# Patient Record
Sex: Male | Born: 1980 | Race: Black or African American | Hispanic: No | Marital: Married | Smoking: Never smoker
Health system: Southern US, Community
[De-identification: ages and names within clinical notes are randomized; demographics above are authoritative.]

## PROBLEM LIST (undated history)

## (undated) DIAGNOSIS — Z87442 Personal history of urinary calculi: Secondary | ICD-10-CM

## (undated) DIAGNOSIS — K5792 Diverticulitis of intestine, part unspecified, without perforation or abscess without bleeding: Secondary | ICD-10-CM

## (undated) DIAGNOSIS — R7303 Prediabetes: Secondary | ICD-10-CM

## (undated) DIAGNOSIS — G510 Bell's palsy: Secondary | ICD-10-CM

## (undated) DIAGNOSIS — M109 Gout, unspecified: Secondary | ICD-10-CM

## (undated) DIAGNOSIS — G4730 Sleep apnea, unspecified: Secondary | ICD-10-CM

## (undated) DIAGNOSIS — I10 Essential (primary) hypertension: Secondary | ICD-10-CM

---

## 2017-07-30 ENCOUNTER — Encounter: Payer: Self-pay | Admitting: Emergency Medicine

## 2017-07-30 ENCOUNTER — Other Ambulatory Visit: Payer: Self-pay

## 2017-07-30 ENCOUNTER — Emergency Department: Payer: BLUE CROSS/BLUE SHIELD

## 2017-07-30 ENCOUNTER — Emergency Department
Admission: EM | Admit: 2017-07-30 | Discharge: 2017-07-30 | Disposition: A | Discharge status: Home / Self Care | Payer: BLUE CROSS/BLUE SHIELD | Attending: Emergency Medicine | Admitting: Emergency Medicine

## 2017-07-30 DIAGNOSIS — N289 Disorder of kidney and ureter, unspecified: Secondary | ICD-10-CM | POA: Insufficient documentation

## 2017-07-30 DIAGNOSIS — I10 Essential (primary) hypertension: Secondary | ICD-10-CM | POA: Diagnosis not present

## 2017-07-30 DIAGNOSIS — K5792 Diverticulitis of intestine, part unspecified, without perforation or abscess without bleeding: Secondary | ICD-10-CM | POA: Insufficient documentation

## 2017-07-30 DIAGNOSIS — R1032 Left lower quadrant pain: Secondary | ICD-10-CM | POA: Diagnosis present

## 2017-07-30 LAB — URINALYSIS, COMPLETE (UACMP) WITH MICROSCOPIC
Bacteria, UA: NONE SEEN
Bilirubin Urine: NEGATIVE
Glucose, UA: NEGATIVE mg/dL
Ketones, ur: NEGATIVE mg/dL
Leukocytes, UA: NEGATIVE
Nitrite: NEGATIVE
Protein, ur: NEGATIVE mg/dL
Specific Gravity, Urine: 1.018 (ref 1.005–1.030)
pH: 5 (ref 5.0–8.0)

## 2017-07-30 LAB — COMPREHENSIVE METABOLIC PANEL
ALT: 29 U/L (ref 17–63)
AST: 25 U/L (ref 15–41)
Albumin: 4.1 g/dL (ref 3.5–5.0)
Alkaline Phosphatase: 48 U/L (ref 38–126)
Anion gap: 8 (ref 5–15)
BUN: 15 mg/dL (ref 6–20)
CO2: 27 mmol/L (ref 22–32)
Calcium: 9.1 mg/dL (ref 8.9–10.3)
Chloride: 104 mmol/L (ref 101–111)
Creatinine, Ser: 1.31 mg/dL — ABNORMAL HIGH (ref 0.61–1.24)
GFR calc Af Amer: >60 mL/min (ref >60)
GFR calc non Af Amer: >60 mL/min (ref >60)
Glucose, Bld: 95 mg/dL (ref 65–99)
Potassium: 4 mmol/L (ref 3.5–5.1)
Sodium: 139 mmol/L (ref 135–145)
Total Bilirubin: 0.9 mg/dL (ref 0.3–1.2)
Total Protein: 8.2 g/dL — ABNORMAL HIGH (ref 6.5–8.1)

## 2017-07-30 LAB — CBC
HCT: 44.2 % (ref 40.0–52.0)
Hemoglobin: 14.3 g/dL (ref 13.0–18.0)
MCH: 27.5 pg (ref 26.0–34.0)
MCHC: 32.4 g/dL (ref 32.0–36.0)
MCV: 85 fL (ref 80.0–100.0)
Platelets: 316 10*3/uL (ref 150–440)
RBC: 5.2 MIL/uL (ref 4.40–5.90)
RDW: 14.2 % (ref 11.5–14.5)
WBC: 14.8 10*3/uL — ABNORMAL HIGH (ref 3.8–10.6)

## 2017-07-30 LAB — LIPASE, BLOOD: Lipase: 26 U/L (ref 11–51)

## 2017-07-30 MED ORDER — METRONIDAZOLE 500 MG PO TABS: 500.0000 mg | ORAL_TABLET | Freq: Two times a day (BID) | ORAL | 0 refills | Status: AC

## 2017-07-30 MED ORDER — HYDROMORPHONE HCL 1 MG/ML IJ SOLN
INTRAMUSCULAR | Status: AC
  Administered 2017-07-30: 0.5 mg via INTRAVENOUS
  Filled 2017-07-30: qty 1

## 2017-07-30 MED ORDER — METRONIDAZOLE 500 MG PO TABS
500.0000 mg | ORAL_TABLET | Freq: Once | ORAL | Status: AC
  Administered 2017-07-30: 500 mg via ORAL
  Filled 2017-07-30: qty 1

## 2017-07-30 MED ORDER — IOPAMIDOL (ISOVUE-370) INJECTION 76%
100.0000 mL | Freq: Once | INTRAVENOUS | Status: AC | PRN
  Administered 2017-07-30: 100 mL via INTRAVENOUS
  Filled 2017-07-30: qty 100

## 2017-07-30 MED ORDER — ONDANSETRON HCL 4 MG/2ML IJ SOLN
INTRAMUSCULAR | Status: AC
  Administered 2017-07-30: 4 mg via INTRAVENOUS
  Filled 2017-07-30: qty 2

## 2017-07-30 MED ORDER — CIPROFLOXACIN HCL 500 MG PO TABS: 500.0000 mg | ORAL_TABLET | Freq: Two times a day (BID) | ORAL | 0 refills | Status: AC

## 2017-07-30 MED ORDER — SODIUM CHLORIDE 0.9 % IV BOLUS (SEPSIS)
1000.0000 mL | Freq: Once | INTRAVENOUS | Status: AC
Start: 2017-07-30 — End: 2017-07-30
  Administered 2017-07-30: 1000 mL via INTRAVENOUS

## 2017-07-30 MED ORDER — OXYCODONE-ACETAMINOPHEN 5-325 MG PO TABS: 1.0000 | ORAL_TABLET | ORAL | 0 refills | Status: AC | PRN

## 2017-07-30 MED ORDER — HYDROMORPHONE HCL 1 MG/ML IJ SOLN
0.5000 mg | Freq: Once | INTRAMUSCULAR | Status: AC
  Administered 2017-07-30: 0.5 mg via INTRAVENOUS

## 2017-07-30 MED ORDER — CIPROFLOXACIN HCL 500 MG PO TABS
500.0000 mg | ORAL_TABLET | Freq: Once | ORAL | Status: AC
  Administered 2017-07-30: 500 mg via ORAL
  Filled 2017-07-30: qty 1

## 2017-07-30 MED ORDER — ONDANSETRON HCL 4 MG/2ML IJ SOLN
4.0000 mg | Freq: Once | INTRAMUSCULAR | Status: AC
  Administered 2017-07-30: 4 mg via INTRAVENOUS

## 2017-07-30 NOTE — ED Notes (Signed)
Pt discharged home after verbalizing understanding of discharge instructions; nad noted. 

## 2017-07-30 NOTE — ED Provider Notes (Signed)
[facility] Emergency Department Provider Note  ____________________________________________  Time seen: Approximately 2:26 PM  I have reviewed the triage vital signs and the nursing notes.   HISTORY  Chief Complaint Abdominal Pain    HPI Terry Weber is a 37 y.o. male with a history of obesity but otherwise healthy presenting with left lower quadrant pain.  The patient reports that since yesterday around 2 PM, the patient has had a "sharp" left lower quadrant pain that is worse with positional changes.  He has had a decrease in his appetite but denies any nausea or vomiting.  His last bowel movement was yesterday at 5:30 PM but it was hard and small.  No fevers, chills, urinary symptoms.  Has not tried anything for his pain.  No history of abdominal surgery.  On arrival to the emergency department, the patient's blood pressure is 183/81 and while he has not seen his physician in in a year or so, he has never been diagnosed with hypertension in the past.  History reviewed. No pertinent past medical history.  There are no active problems to display for this patient.   History reviewed. No pertinent surgical history.    Allergies Patient has no known allergies.  No family history on file.  Social History Social History   Tobacco Use  . Smoking status: Never Smoker  . Smokeless tobacco: Never Used  Substance Use Topics  . Alcohol use: No    Frequency: Never  . Drug use: No    Review of Systems Constitutional: No fever/chills.  No lightheadedness or syncope. Eyes: No visual changes. ENT: No sore throat. No congestion or rhinorrhea. Cardiovascular: Denies chest pain. Denies palpitations. Respiratory: Denies shortness of breath.  No cough. Gastrointestinal: Positive left lower quadrant abdominal pain.  No nausea, no vomiting.  Positive decreased appetite.  No diarrhea.  Positive constipation. Genitourinary: Negative for dysuria. Musculoskeletal:  Negative for back pain. Skin: Negative for rash. Neurological: Negative for headaches. No focal numbness, tingling or weakness.     ____________________________________________   PHYSICAL EXAM:  VITAL SIGNS: ED Triage Vitals  Enc Vitals Group     BP 07/30/17 1004 140/83     Pulse Rate 07/30/17 1004 82     Resp 07/30/17 1004 20     Temp 07/30/17 1004 99.1 F (37.3 C)     Temp Source 07/30/17 1004 Oral     SpO2 07/30/17 1004 97 %     Weight 07/30/17 1006 (!) 330 lb (149.7 kg)     Height 07/30/17 1006 6\' 3"  (1.905 m)     Head Circumference --      Peak Flow --      Pain Score 07/30/17 1008 6     Pain Loc --      Pain Edu? --      Excl. in GC? --     Constitutional: Alert and oriented. Well appearing and in no acute distress. Answers questions appropriately. Eyes: Conjunctivae are normal.  EOMI. No scleral icterus. Head: Atraumatic. Nose: No congestion/rhinnorhea. Mouth/Throat: Mucous membranes are mildly dry.  Neck: No stridor.  Supple.   Cardiovascular: Normal rate, regular rhythm. No murmurs, rubs or gallops.  Respiratory: Normal respiratory effort.  No accessory muscle use or retractions. Lungs CTAB.  No wheezes, rales or ronchi. Gastrointestinal: Morbidly obese soft, and nondistended.  Tender to palpation in the left lower quadrant peer no guarding or rebound.  No peritoneal signs. Musculoskeletal: No LE edema. Neurologic:  A&Ox3.  Speech is clear.  Face and smile are symmetric.  EOMI.  Moves all extremities well. Skin:  Skin is warm, dry and intact. No rash noted. Psychiatric: Mood and affect are normal. Speech and behavior are normal.  Normal judgement.  ____________________________________________   LABS (all labs ordered are listed, but only abnormal results are displayed)  Labs Reviewed  COMPREHENSIVE METABOLIC PANEL - Abnormal; Notable for the following components:      Result Value   Creatinine, Ser 1.31 (*)    Total Protein 8.2 (*)    All other  components within normal limits  CBC - Abnormal; Notable for the following components:   WBC 14.8 (*)    All other components within normal limits  URINALYSIS, COMPLETE (UACMP) WITH MICROSCOPIC - Abnormal; Notable for the following components:   Color, Urine YELLOW (*)    APPearance CLEAR (*)    Hgb urine dipstick SMALL (*)    Squamous Epithelial / LPF 0-5 (*)    All other components within normal limits  LIPASE, BLOOD   ____________________________________________  EKG  Not indicated ____________________________________________  RADIOLOGY  Ct Abdomen Pelvis W Contrast  Result Date: 07/30/2017 CLINICAL DATA:  Left lower quadrant abdomen pain for 1.5 days. EXAM: CT ABDOMEN AND PELVIS WITH CONTRAST TECHNIQUE: Multidetector CT imaging of the abdomen and pelvis was performed using the standard protocol following bolus administration of intravenous contrast. CONTRAST:  ISOVUE-370 IOPAMIDOL (ISOVUE-370) INJECTION 76% COMPARISON:  None. FINDINGS: Lower chest: No acute abnormality. Hepatobiliary: No focal liver abnormality is seen. No gallstones, gallbladder wall thickening, or biliary dilatation. Pancreas: Unremarkable. No pancreatic ductal dilatation or surrounding inflammatory changes. Spleen: Normal in size without focal abnormality. Adrenals/Urinary Tract: Adrenal glands are unremarkable. Kidneys are normal, without renal calculi, focal lesion, or hydronephrosis. Bladder is unremarkable. Stomach/Bowel: The stomach is normal. There is no small bowel obstruction. The appendix is normal. There is inflammation and bowel wall thickening surrounding the distal descending colon/proximal sigmoid colon. The colon is otherwise normal. Vascular/Lymphatic: Aortic atherosclerosis. No enlarged abdominal or pelvic lymph nodes. Reproductive: Prostate is unremarkable. Other: Minimal umbilical herniation of mesenteric fat is noted. Musculoskeletal: Minimal degenerative joint changes of lower thoracic spine  is noted. IMPRESSION: Findings consistent with acute descending colon/sigmoid diverticulitis. Electronically Signed   By: Sherian Rein M.D.   On: 07/30/2017 15:08    ____________________________________________   PROCEDURES  Procedure(s) performed: None  Procedures  Critical Care performed: No ____________________________________________   INITIAL IMPRESSION / ASSESSMENT AND PLAN / ED COURSE  Pertinent labs & imaging results that were available during my care of the patient were reviewed by me and considered in my medical decision making (see chart for details).  37 y.o. male without any history of abdominal surgery presenting with left lower quadrant pain, decreased appetite and constipation.  Here, the patient is afebrile.  He does have a blood pressure 10 D3 over 81 and we will recheck this as he does not have a history of hypertension.  Today he also has some renal insufficiency which could be due to dehydration from his decreased p.o. intake, although it could also be from undiagnosed hypertension.  We will treat him with fluids, and he has been counseled to have his kidney function rechecked with his primary care physician.  For the patient's abdominal pain, I am concerned about diverticulitis.  Partial small bowel obstruction is less likely especially with a negative abdomen.  Aortic pathology is very unlikely.  We will proceed with a CT scan of the abdomen to evaluate for diverticulitis and/or  any complications from diverticulitis or other pathology.  Symptomatic treatment has been ordered.  Plan reevaluation for final disposition.  ----------------------------------------- 3:29 PM on 07/30/2017 -----------------------------------------  The patient's repeat blood pressure without any intervention other than pain medication is 133/82.  I have asked him to follow-up with his primary care physician for this.  He has received intravenous fluids, which should help his renal  insufficiency but this will also be rechecked by a primary care physician.  The patient CT scan is consistent with uncomplicated sigmoid and colonic diverticulitis and he has received his first dose of oral antibiotics here.  I talked to him about taking the entire course of antibiotics at home.  He will follow-up with a primary care physician.  Return precautions were discussed.  At this time, the patient is tolerating liquids and safe for discharge home.  ____________________________________________  FINAL CLINICAL IMPRESSION(S) / ED DIAGNOSES  Final diagnoses:  Diverticulitis  Hypertension, unspecified type  Renal insufficiency         NEW MEDICATIONS STARTED DURING THIS VISIT:  New Prescriptions   CIPROFLOXACIN (CIPRO) 500 MG TABLET    Take 1 tablet (500 mg total) by mouth 2 (two) times daily for 10 days.   METRONIDAZOLE (FLAGYL) 500 MG TABLET    Take 1 tablet (500 mg total) by mouth 2 (two) times daily for 10 days.   OXYCODONE-ACETAMINOPHEN (PERCOCET) 5-325 MG TABLET    Take 1 tablet by mouth every 4 (four) hours as needed for severe pain.      Rockne Menghini, MD 07/30/17 1530

## 2017-07-30 NOTE — ED Triage Notes (Signed)
Pt c/o left lower quad abd pain since yesterday. Pt states that he has had nausea without vomiting and constipation. Pt in NAD at this time.

## 2017-07-30 NOTE — ED Triage Notes (Signed)
First nurse.  Left lower quad pain

## 2017-07-30 NOTE — ED Notes (Signed)
Pt presents with llq pain since yesterday. States he feels like he may be constipated; denies urinary symptoms. States "it feels like the muscle in that area might be stressed or strained." Pt alert & oriented with NAD noted.

## 2017-07-30 NOTE — Discharge Instructions (Signed)
Today have an infection in your colon, large intestine, called diverticulitis.  Please take the entire course of antibiotics, even if you are feeling better.  For your pain, you may take Tylenol but please avoid NSAID medications including Advil, ibuprofen, Motrin or Aleve until your kidney function has been rechecked as this can worsen your kidney function.  Percocet is for severe pain.  Do not drive within 8 hours of taking Percocet.  Please drink plenty of fluids stay well-hydrated.  Please take a full liquid diet for the next 24 hours and then advance to bland diet as tolerated.  Your kidney function was slightly abnormal today.  This can be from dehydration, but there are other possible causes.  Your kidney function will need to be rechecked by your primary care physician or [facility].  You will also need to have your blood pressure rechecked.  Return to the emergency department if you develop severe pain, inability to keep down fluids, fever, or any other symptoms concerning to you.

## 2019-08-17 ENCOUNTER — Emergency Department
Admission: EM | Admit: 2019-08-17 | Discharge: 2019-08-17 | Disposition: A | Discharge status: Home / Self Care | Payer: BC Managed Care – PPO | Attending: Emergency Medicine | Admitting: Emergency Medicine

## 2019-08-17 ENCOUNTER — Other Ambulatory Visit: Payer: Self-pay

## 2019-08-17 ENCOUNTER — Emergency Department: Payer: BC Managed Care – PPO

## 2019-08-17 DIAGNOSIS — R1032 Left lower quadrant pain: Secondary | ICD-10-CM | POA: Diagnosis present

## 2019-08-17 DIAGNOSIS — K5732 Diverticulitis of large intestine without perforation or abscess without bleeding: Secondary | ICD-10-CM | POA: Diagnosis not present

## 2019-08-17 HISTORY — DX: Gout, unspecified: M10.9

## 2019-08-17 LAB — COMPREHENSIVE METABOLIC PANEL
ALT: 27 U/L (ref 0–44)
AST: 24 U/L (ref 15–41)
Albumin: 3.9 g/dL (ref 3.5–5.0)
Alkaline Phosphatase: 55 U/L (ref 38–126)
Anion gap: 8 (ref 5–15)
BUN: 12 mg/dL (ref 6–20)
CO2: 27 mmol/L (ref 22–32)
Calcium: 9.1 mg/dL (ref 8.9–10.3)
Chloride: 105 mmol/L (ref 98–111)
Creatinine, Ser: 1.26 mg/dL — ABNORMAL HIGH (ref 0.61–1.24)
GFR calc Af Amer: >60 mL/min (ref >60)
GFR calc non Af Amer: >60 mL/min (ref >60)
Glucose, Bld: 109 mg/dL — ABNORMAL HIGH (ref 70–99)
Potassium: 4.5 mmol/L (ref 3.5–5.1)
Sodium: 140 mmol/L (ref 135–145)
Total Bilirubin: 0.5 mg/dL (ref 0.3–1.2)
Total Protein: 7.8 g/dL (ref 6.5–8.1)

## 2019-08-17 LAB — CBC WITH DIFFERENTIAL/PLATELET
Abs Immature Granulocytes: 0.04 10*3/uL (ref 0.00–0.07)
Basophils Absolute: 0 10*3/uL (ref 0.0–0.1)
Basophils Relative: 0 %
Eosinophils Absolute: 0.1 10*3/uL (ref 0.0–0.5)
Eosinophils Relative: 1 %
HCT: 43.7 % (ref 39.0–52.0)
Hemoglobin: 13.8 g/dL (ref 13.0–17.0)
Immature Granulocytes: 0 %
Lymphocytes Relative: 16 %
Lymphs Abs: 2.1 10*3/uL (ref 0.7–4.0)
MCH: 27.4 pg (ref 26.0–34.0)
MCHC: 31.6 g/dL (ref 30.0–36.0)
MCV: 86.7 fL (ref 80.0–100.0)
Monocytes Absolute: 1.2 10*3/uL — ABNORMAL HIGH (ref 0.1–1.0)
Monocytes Relative: 9 %
Neutro Abs: 10.1 10*3/uL — ABNORMAL HIGH (ref 1.7–7.7)
Neutrophils Relative %: 74 %
Platelets: 321 10*3/uL (ref 150–400)
RBC: 5.04 MIL/uL (ref 4.22–5.81)
RDW: 13.7 % (ref 11.5–15.5)
WBC: 13.5 10*3/uL — ABNORMAL HIGH (ref 4.0–10.5)
nRBC: 0 % (ref 0.0–0.2)

## 2019-08-17 LAB — URINALYSIS, COMPLETE (UACMP) WITH MICROSCOPIC
Bacteria, UA: NONE SEEN
Bilirubin Urine: NEGATIVE
Glucose, UA: NEGATIVE mg/dL
Ketones, ur: NEGATIVE mg/dL
Leukocytes,Ua: NEGATIVE
Nitrite: NEGATIVE
Protein, ur: NEGATIVE mg/dL
Specific Gravity, Urine: 1.017 (ref 1.005–1.030)
pH: 5 (ref 5.0–8.0)

## 2019-08-17 MED ORDER — CIPROFLOXACIN HCL 500 MG PO TABS
500.0000 mg | ORAL_TABLET | Freq: Once | ORAL | Status: AC
  Administered 2019-08-17: 500 mg via ORAL
  Filled 2019-08-17: qty 1

## 2019-08-17 MED ORDER — METRONIDAZOLE 500 MG PO TABS
500.0000 mg | ORAL_TABLET | Freq: Once | ORAL | Status: AC
Start: 2019-08-17 — End: 2019-08-17
  Administered 2019-08-17: 500 mg via ORAL
  Filled 2019-08-17: qty 1

## 2019-08-17 MED ORDER — CIPROFLOXACIN HCL 500 MG PO TABS: 500.0000 mg | ORAL_TABLET | Freq: Two times a day (BID) | ORAL | 0 refills | Status: AC

## 2019-08-17 MED ORDER — METRONIDAZOLE 500 MG PO TABS: 500.0000 mg | ORAL_TABLET | Freq: Three times a day (TID) | ORAL | 0 refills | Status: DC

## 2019-08-17 MED ORDER — OXYCODONE-ACETAMINOPHEN 5-325 MG PO TABS: 1.0000 | ORAL_TABLET | Freq: Three times a day (TID) | ORAL | 0 refills | Status: AC | PRN

## 2019-08-17 NOTE — ED Provider Notes (Signed)
[facility] Emergency Department Provider Note       Time seen: ----------------------------------------- 8:49 AM on 08/17/2019 -----------------------------------------   I have reviewed the triage vital signs and the nursing notes.  HISTORY   Chief Complaint Abdominal Pain    HPI Terry Weber is a 39 y.o. male with a history of gout who presents to the ED for left-sided abdominal pain since Saturday.  Patient denies nausea, vomiting or diarrhea.  Patient states he took the magnesium citrate and had a bowel movement that made him feel slightly better but the pain recurred.  Discomfort was 6 out of 10.  Past Medical History:  Diagnosis Date  . Gout     There are no problems to display for this patient.   History reviewed. No pertinent surgical history.  Allergies Patient has no known allergies.  Social History Social History   Tobacco Use  . Smoking status: Never Smoker  . Smokeless tobacco: Never Used  Substance Use Topics  . Alcohol use: No  . Drug use: No    Review of Systems Constitutional: Negative for fever. Cardiovascular: Negative for chest pain. Respiratory: Negative for shortness of breath. Gastrointestinal: Positive for abdominal pain, constipation Musculoskeletal: Negative for back pain. Skin: Negative for rash. Neurological: Negative for headaches, focal weakness or numbness.  All systems negative/normal/unremarkable except as stated in the HPI  ____________________________________________   PHYSICAL EXAM:  VITAL SIGNS: ED Triage Vitals  Enc Vitals Group     BP 08/17/19 0841 128/81     Pulse Rate 08/17/19 0841 77     Resp 08/17/19 0841 19     Temp 08/17/19 0841 98.8 F (37.1 C)     Temp src --      SpO2 08/17/19 0841 98 %     Weight 08/17/19 0839 (!) 340 lb (154.2 kg)     Height 08/17/19 0839 6\' 3"  (1.905 m)     Head Circumference --      Peak Flow --      Pain Score 08/17/19 0839 6     Pain Loc --    Pain Edu? --      Excl. in GC? --     Constitutional: Alert and oriented. Well appearing and in no distress. Eyes: Conjunctivae are normal. Normal extraocular movements. Cardiovascular: Normal rate, regular rhythm. No murmurs, rubs, or gallops. Respiratory: Normal respiratory effort without tachypnea nor retractions. Breath sounds are clear and equal bilaterally. No wheezes/rales/rhonchi. Gastrointestinal: Mild left flank tenderness, no rebound or guarding.  Normal bowel sounds. Musculoskeletal: Nontender with normal range of motion in extremities. No lower extremity tenderness nor edema. Neurologic:  Normal speech and language. No gross focal neurologic deficits are appreciated.  Skin:  Skin is warm, dry and intact. No rash noted. Psychiatric: Mood and affect are normal. Speech and behavior are normal.  ___________________________________________  ED COURSE:  As part of my medical decision making, I reviewed the following data within the electronic MEDICAL RECORD NUMBER History obtained from family if available, nursing notes, old chart and ekg, as well as notes from prior ED visits. Patient presented for abdominal pain, we will assess with labs and imaging as indicated at this time.   Procedures  Maxx Pham was evaluated in Emergency Department on 08/17/2019 for the symptoms described in the history of present illness. He was evaluated in the context of the global COVID-19 pandemic, which necessitated consideration that the patient might be at risk for infection with the SARS-CoV-2 virus that causes COVID-19. Institutional  protocols and algorithms that pertain to the evaluation of patients at risk for COVID-19 are in a state of rapid change based on information released by regulatory bodies including the CDC and federal and state organizations. These policies and algorithms were followed during the patient's care in the ED.  ____________________________________________   LABS (pertinent  positives/negatives)  Labs Reviewed  CBC WITH DIFFERENTIAL/PLATELET - Abnormal; Notable for the following components:      Result Value   WBC 13.5 (*)    Neutro Abs 10.1 (*)    Monocytes Absolute 1.2 (*)    All other components within normal limits  COMPREHENSIVE METABOLIC PANEL - Abnormal; Notable for the following components:   Glucose, Bld 109 (*)    Creatinine, Ser 1.26 (*)    All other components within normal limits  URINALYSIS, COMPLETE (UACMP) WITH MICROSCOPIC    RADIOLOGY Images were viewed by me  CT renal protocol IMPRESSION:  1. Localized diverticulitis in the distal descending colon. No  evident perforation or abscess in this area. Foci of diverticulosis  noted elsewhere in the descending and sigmoid colon regions without  inflammation elsewhere beyond the localized diverticulitis in the  distal descending colon.   2. No bowel obstruction. No abscess in the abdomen or pelvis.  Appendix appears normal.   3. Nonobstructing 2 mm calculus in each kidney. No hydronephrosis or  ureteral calculus on either side. Urinary bladder wall thickness  normal.   4. Small umbilical hernia containing only fat.  ____________________________________________   DIFFERENTIAL DIAGNOSIS   Renal colic, UTI, pyelonephritis, constipation, diverticulitis  FINAL ASSESSMENT AND PLAN  Flank pain, diverticulitis   Plan: The patient had presented for left flank pain. Patient's labs did reveal leukocytosis. Patient's imaging revealed a localized diverticulitis in the descending colon.  Patient will be given Cipro, Flagyl and pain medicine.  Be referred to his doctor for close outpatient follow-up.   Laurence Aly, MD    Note: This note was generated in part or whole with voice recognition software. Voice recognition is usually quite accurate but there are transcription errors that can and very often do occur. I apologize for any typographical errors that were not detected and  corrected.     Earleen Newport, MD 08/17/19 [phone]

## 2019-08-17 NOTE — ED Notes (Signed)
Introduced self to pt and visitor at bedside. Pt declined blanket. Pt states has been updated by EDP. Requested "timeframe" from [facility]. EDP notified. Denies any other needs currently. Bed locked low. Rail up. Call bell within reach.

## 2019-08-17 NOTE — ED Triage Notes (Signed)
Pt c/o left sided abd pain since Saturday. Denies N/V/D.Marland Kitchen states he took the magnesium citrate and had a BM and was better for a little while but returned.

## 2019-10-14 ENCOUNTER — Ambulatory Visit: Payer: BC Managed Care – PPO | Admitting: Urology

## 2019-10-20 ENCOUNTER — Ambulatory Visit: Payer: BC Managed Care – PPO | Admitting: *Deleted

## 2019-11-04 ENCOUNTER — Ambulatory Visit: Payer: BC Managed Care – PPO | Admitting: Urology

## 2020-01-04 ENCOUNTER — Other Ambulatory Visit: Payer: BC Managed Care – PPO

## 2020-01-06 ENCOUNTER — Ambulatory Visit: Admission: RE | Admit: 2020-01-06 | Payer: BC Managed Care – PPO | Source: Ambulatory Visit | Admitting: General Surgery

## 2020-01-06 ENCOUNTER — Encounter: Admission: RE | Payer: Self-pay | Source: Ambulatory Visit

## 2020-01-06 SURGERY — COLONOSCOPY WITH PROPOFOL
Anesthesia: General

## 2020-03-24 ENCOUNTER — Other Ambulatory Visit
Admission: RE | Admit: 2020-03-24 | Discharge: 2020-03-24 | Disposition: A | Discharge status: Home / Self Care | Payer: HRSA Program | Source: Ambulatory Visit | Attending: Gastroenterology | Admitting: Gastroenterology

## 2020-03-24 ENCOUNTER — Other Ambulatory Visit: Payer: Self-pay

## 2020-03-24 DIAGNOSIS — Z20822 Contact with and (suspected) exposure to covid-19: Secondary | ICD-10-CM | POA: Insufficient documentation

## 2020-03-24 DIAGNOSIS — Z01812 Encounter for preprocedural laboratory examination: Secondary | ICD-10-CM | POA: Diagnosis present

## 2020-03-24 LAB — SARS CORONAVIRUS 2 (TAT 6-24 HRS): SARS Coronavirus 2: NEGATIVE

## 2020-03-28 ENCOUNTER — Encounter
Admission: RE | Disposition: A | Discharge status: Home / Self Care | Payer: Self-pay | Source: Ambulatory Visit | Attending: Gastroenterology

## 2020-03-28 ENCOUNTER — Ambulatory Visit: Payer: Self-pay | Admitting: Certified Registered Nurse Anesthetist

## 2020-03-28 ENCOUNTER — Ambulatory Visit
Admission: RE | Admit: 2020-03-28 | Discharge: 2020-03-28 | Disposition: A | Discharge status: Home / Self Care | Payer: Self-pay | Source: Ambulatory Visit | Attending: Gastroenterology | Admitting: Gastroenterology

## 2020-03-28 ENCOUNTER — Other Ambulatory Visit: Payer: Self-pay

## 2020-03-28 DIAGNOSIS — K635 Polyp of colon: Secondary | ICD-10-CM | POA: Insufficient documentation

## 2020-03-28 DIAGNOSIS — K5730 Diverticulosis of large intestine without perforation or abscess without bleeding: Secondary | ICD-10-CM | POA: Insufficient documentation

## 2020-03-28 DIAGNOSIS — K640 First degree hemorrhoids: Secondary | ICD-10-CM | POA: Insufficient documentation

## 2020-03-28 DIAGNOSIS — Z87442 Personal history of urinary calculi: Secondary | ICD-10-CM | POA: Insufficient documentation

## 2020-03-28 DIAGNOSIS — I10 Essential (primary) hypertension: Secondary | ICD-10-CM | POA: Insufficient documentation

## 2020-03-28 DIAGNOSIS — Z79899 Other long term (current) drug therapy: Secondary | ICD-10-CM | POA: Insufficient documentation

## 2020-03-28 HISTORY — PX: COLONOSCOPY WITH PROPOFOL: SHX5780

## 2020-03-28 HISTORY — DX: Essential (primary) hypertension: I10

## 2020-03-28 HISTORY — DX: Personal history of urinary calculi: Z87.442

## 2020-03-28 HISTORY — DX: Sleep apnea, unspecified: G47.30

## 2020-03-28 SURGERY — COLONOSCOPY WITH PROPOFOL
Anesthesia: General

## 2020-03-28 MED ORDER — LIDOCAINE HCL (CARDIAC) PF 100 MG/5ML IV SOSY
PREFILLED_SYRINGE | INTRAVENOUS | Status: DC | PRN
  Administered 2020-03-28: 50 mg via INTRAVENOUS

## 2020-03-28 MED ORDER — PROPOFOL 10 MG/ML IV BOLUS
INTRAVENOUS | Status: DC | PRN
  Administered 2020-03-28: 30 mg via INTRAVENOUS
  Administered 2020-03-28: 70 mg via INTRAVENOUS

## 2020-03-28 MED ORDER — PROPOFOL 500 MG/50ML IV EMUL
INTRAVENOUS | Status: DC | PRN
  Administered 2020-03-28: 150 ug/kg/min via INTRAVENOUS

## 2020-03-28 MED ORDER — SODIUM CHLORIDE 0.9 % IV SOLN: INTRAVENOUS | Status: DC

## 2020-03-28 MED ORDER — PROPOFOL 500 MG/50ML IV EMUL
INTRAVENOUS | Status: AC
  Filled 2020-03-28: qty 50

## 2020-03-28 MED ORDER — LIDOCAINE HCL (PF) 2 % IJ SOLN
INTRAMUSCULAR | Status: AC
  Filled 2020-03-28: qty 5

## 2020-03-28 NOTE — Anesthesia Postprocedure Evaluation (Signed)
Anesthesia Post Note  Patient: Terry Weber  Procedure(s) Performed: COLONOSCOPY WITH PROPOFOL (N/A )  Patient location during evaluation: PACU Anesthesia Type: General Level of consciousness: awake and alert Pain management: pain level controlled Vital Signs Assessment: post-procedure vital signs reviewed and stable Respiratory status: spontaneous breathing, nonlabored ventilation and respiratory function stable Cardiovascular status: blood pressure returned to baseline and stable Postop Assessment: no apparent nausea or vomiting Anesthetic complications: no   No complications documented.   Last Vitals:  Vitals:   03/28/20 0853 03/28/20 0939  BP: (!) 137/95 (!) 97/55  Pulse: 90 83  Resp: 18 16  Temp: (!) 35.7 C 36.7 C  SpO2: 100% 98%    Last Pain:  Vitals:   03/28/20 0959  TempSrc:   PainSc: 0-No pain                 Aurelio Brash Gayna Braddy

## 2020-03-28 NOTE — Op Note (Signed)
[facility] Gastroenterology Patient Name: Terry Weber Procedure Date: 03/28/2020 9:02 AM MRN: [id number] Account #: [id number] Date of Birth: [birth year] Admit Type: Outpatient Age: 39 Room: [facility] ENDO ROOM 3 Gender: Male Note Status: Finalized Procedure:             Colonoscopy Indications:           Abnormal CT of the GI tract, Diverticulitis Providers:             Andrey Farmer MD, MD Medicines:             Monitored Anesthesia Care Complications:         No immediate complications. Estimated blood loss:                         Minimal. Procedure:             Pre-Anesthesia Assessment:                        - Prior to the procedure, a History and Physical was                         performed, and patient medications and allergies were                         reviewed. The patient is competent. The risks and                         benefits of the procedure and the sedation options and                         risks were discussed with the patient. All questions                         were answered and informed consent was obtained.                         Patient identification and proposed procedure were                         verified by the physician, the nurse, the anesthetist                         and the technician in the endoscopy suite. Mental                         Status Examination: alert and oriented. Airway                         Examination: normal oropharyngeal airway and neck                         mobility. Respiratory Examination: clear to                         auscultation. CV Examination: normal. Prophylactic                         Antibiotics: The patient does not require prophylactic  antibiotics. Prior Anticoagulants: The patient has                         taken no previous anticoagulant or antiplatelet                         agents. ASA Grade Assessment: II - A patient with mild                          systemic disease. After reviewing the risks and                         benefits, the patient was deemed in satisfactory                         condition to undergo the procedure. The anesthesia                         plan was to use monitored anesthesia care (MAC).                         Immediately prior to administration of medications,                         the patient was re-assessed for adequacy to receive                         sedatives. The heart rate, respiratory rate, oxygen                         saturations, blood pressure, adequacy of pulmonary                         ventilation, and response to care were monitored                         throughout the procedure. The physical status of the                         patient was re-assessed after the procedure.                        After obtaining informed consent, the colonoscope was                         passed under direct vision. Throughout the procedure,                         the patient's blood pressure, pulse, and oxygen                         saturations were monitored continuously. The                         Colonoscope was introduced through the anus and                         advanced to the the cecum, identified by appendiceal  orifice and ileocecal valve. The colonoscopy was                         performed without difficulty. The patient tolerated                         the procedure well. The quality of the bowel                         preparation was good. Findings:      The perianal and digital rectal examinations were normal.      A single small-mouthed diverticulum was found in the ascending colon.      Many small-mouthed diverticula were found in the sigmoid colon,       descending colon and splenic flexure.      A less than 1 mm polyp was found in the sigmoid colon. The polyp was       sessile. The polyp was removed with a jumbo cold forceps. Resection and        retrieval were complete. Estimated blood loss was minimal.      Non-bleeding internal hemorrhoids were found during retroflexion. The       hemorrhoids were Grade I (internal hemorrhoids that do not prolapse).      The exam was otherwise without abnormality on direct and retroflexion       views. Impression:            - Diverticulosis in the ascending colon.                        - Diverticulosis in the sigmoid colon, in the                         descending colon and at the splenic flexure.                        - One less than 1 mm polyp in the sigmoid colon,                         removed with a jumbo cold forceps. Resected and                         retrieved.                        - Non-bleeding internal hemorrhoids.                        - The examination was otherwise normal on direct and                         retroflexion views. Recommendation:        - Discharge patient to home.                        - Resume previous diet.                        - Continue present medications.                        -  Await pathology results.                        - Repeat colonoscopy in 10 years for screening                         purposes.                        - Return to referring physician as previously                         scheduled. Procedure Code(s):     --- Professional ---                        [id number], Colonoscopy, flexible; with biopsy, single or                         multiple Diagnosis Code(s):     --- Professional ---                        K64.0, First degree hemorrhoids                        K63.5, Polyp of colon                        K57.32, Diverticulitis of large intestine without                         perforation or abscess without bleeding                        K57.30, Diverticulosis of large intestine without                         perforation or abscess without bleeding                        R93.3, Abnormal findings on diagnostic imaging of                          other parts of digestive tract CPT copyright 2019 American Medical Association. All rights reserved. The codes documented in this report are preliminary and upon coder review may  be revised to meet current compliance requirements. Andrey Farmer, MD Andrey Farmer MD, MD 03/28/2020 9:40:05 AM Number of Addenda: 0 Note Initiated On: 03/28/2020 9:02 AM Scope Withdrawal Time: 0 hours 8 minutes 17 seconds  Total Procedure Duration: 0 hours 12 minutes 49 seconds  Estimated Blood Loss:  Estimated blood loss was minimal.      [facility]

## 2020-03-28 NOTE — Transfer of Care (Signed)
Immediate Anesthesia Transfer of Care Note  Patient: Terry Weber  Procedure(s) Performed: COLONOSCOPY WITH PROPOFOL (N/A )  Patient Location: Endoscopy Unit  Anesthesia Type:General  Level of Consciousness: drowsy  Airway & Oxygen Therapy: Patient Spontanous Breathing  Post-op Assessment: Report given to RN and Post -op Vital signs reviewed and stable  Post vital signs: Reviewed and stable  Last Vitals:  Vitals Value Taken Time  BP 97/55 03/28/20 0942  Temp 36.7 C 03/28/20 0939  Pulse 80 03/28/20 0942  Resp 13 03/28/20 0942  SpO2 97 % 03/28/20 0942  Vitals shown include unvalidated device data.  Last Pain:  Vitals:   03/28/20 0939  TempSrc: Temporal  PainSc: Asleep         Complications: No complications documented.

## 2020-03-28 NOTE — Interval H&P Note (Signed)
History and Physical Interval Note:  03/28/2020 9:14 AM  Terry Weber  has presented today for surgery, with the diagnosis of DIVERTICULITIS.  The various methods of treatment have been discussed with the patient and family. After consideration of risks, benefits and other options for treatment, the patient has consented to  Procedure(s): COLONOSCOPY WITH PROPOFOL (N/A) as a surgical intervention.  The patient's history has been reviewed, patient examined, no change in status, stable for surgery.  I have reviewed the patient's chart and labs.  Questions were answered to the patient's satisfaction.     Regis Bill  Ok to proceed with colonoscopy

## 2020-03-28 NOTE — H&P (Signed)
Outpatient short stay form Pre-procedure 03/28/2020 9:12 AM Merlyn Lot MD, MPH  Primary Physician: [facility]  Reason for visit:  Hx of diverticulitis  History of present illness:   39 y/o gentleman with history of two episodes of diverticulitis with last episodes > 6 weeks ago and patient is currently pain free. No abdominal surgeries, blood thinners, or family history of GI malignancies. This is his first colonoscopy.    Current Facility-Administered Medications:  .  0.9 %  sodium chloride infusion, , Intravenous, Continuous, Johnmatthew Solorio, Rossie Muskrat, MD, Last Rate: 20 mL/hr at 03/28/20 0911, New Bag at 03/28/20 0911  Medications Prior to Admission  Medication Sig Dispense Refill Last Dose  . allopurinol (ZYLOPRIM) 100 MG tablet Take 100 mg by mouth 2 (two) times daily.   Past Week at Unknown time  . atorvastatin (LIPITOR) 20 MG tablet Take 20 mg by mouth daily.   Past Week at Unknown time  . losartan (COZAAR) 25 MG tablet Take 25 mg by mouth daily.     Marland Kitchen oxyCODONE-acetaminophen (PERCOCET) 5-325 MG tablet Take 1 tablet by mouth every 4 (four) hours as needed for severe pain. 12 tablet 0   . oxyCODONE-acetaminophen (PERCOCET) 5-325 MG tablet Take 1 tablet by mouth every 8 (eight) hours as needed. 20 tablet 0      No Known Allergies   Past Medical History:  Diagnosis Date  . Gout   . History of kidney stones   . Hypertension   . Sleep apnea     Review of systems:  Otherwise negative.    Physical Exam  Gen: Alert, oriented. Appears stated age.  HEENT: PERRLA. Lungs: No respiratory distress CV: RRR Abd: soft, benign, no masses. Ext: No edema.     Planned procedures: Proceed with colonoscopy. The patient understands the nature of the planned procedure, indications, risks, alternatives and potential complications including but not limited to bleeding, infection, perforation, damage to internal organs and possible oversedation/side effects from anesthesia.  The patient agrees and gives consent to proceed.  Please refer to procedure notes for findings, recommendations and patient disposition/instructions.     Merlyn Lot MD, MPH Gastroenterology 03/28/2020  9:12 AM

## 2020-03-28 NOTE — Anesthesia Preprocedure Evaluation (Signed)
Anesthesia Evaluation  Patient identified by MRN, date of birth, ID band Patient awake    Reviewed: Allergy & Precautions, H&P , NPO status , Patient's Chart, lab work & pertinent test results  History of Anesthesia Complications Negative for: history of anesthetic complications  Airway Mallampati: III  TM Distance: >3 FB Neck ROM: full    Dental  (+) Teeth Intact   Pulmonary sleep apnea , neg COPD,    breath sounds clear to auscultation       Cardiovascular hypertension, (-) angina(-) Past MI and (-) Cardiac Stents (-) dysrhythmias  Rhythm:regular Rate:Normal     Neuro/Psych negative neurological ROS  negative psych ROS   GI/Hepatic negative GI ROS, Neg liver ROS,   Endo/Other  negative endocrine ROS  Renal/GU negative Renal ROS  negative genitourinary   Musculoskeletal   Abdominal   Peds  Hematology negative hematology ROS (+)   Anesthesia Other Findings Past Medical History: No date: Gout No date: History of kidney stones No date: Hypertension No date: Sleep apnea  No past surgical history on file.  BMI    Body Mass Index: 42.50 kg/m      Reproductive/Obstetrics negative OB ROS                             Anesthesia Physical Anesthesia Plan  ASA: III  Anesthesia Plan: General   Post-op Pain Management:    Induction:   PONV Risk Score and Plan: Propofol infusion and TIVA  Airway Management Planned: Simple Face Mask  Additional Equipment:   Intra-op Plan:   Post-operative Plan:   Informed Consent: I have reviewed the patients History and Physical, chart, labs and discussed the procedure including the risks, benefits and alternatives for the proposed anesthesia with the patient or authorized representative who has indicated his/her understanding and acceptance.     Dental Advisory Given  Plan Discussed with: Anesthesiologist, CRNA and Surgeon  Anesthesia  Plan Comments:         Anesthesia Quick Evaluation

## 2020-03-29 LAB — SURGICAL PATHOLOGY

## 2020-09-22 ENCOUNTER — Encounter: Payer: Self-pay | Admitting: Emergency Medicine

## 2020-09-22 ENCOUNTER — Emergency Department: Payer: BC Managed Care – PPO

## 2020-09-22 ENCOUNTER — Emergency Department
Admission: EM | Admit: 2020-09-22 | Discharge: 2020-09-22 | Disposition: A | Discharge status: Home / Self Care | Payer: BC Managed Care – PPO | Attending: Emergency Medicine | Admitting: Emergency Medicine

## 2020-09-22 ENCOUNTER — Other Ambulatory Visit: Payer: Self-pay

## 2020-09-22 DIAGNOSIS — I10 Essential (primary) hypertension: Secondary | ICD-10-CM | POA: Insufficient documentation

## 2020-09-22 DIAGNOSIS — R109 Unspecified abdominal pain: Secondary | ICD-10-CM | POA: Diagnosis present

## 2020-09-22 DIAGNOSIS — K5732 Diverticulitis of large intestine without perforation or abscess without bleeding: Secondary | ICD-10-CM | POA: Insufficient documentation

## 2020-09-22 DIAGNOSIS — R3121 Asymptomatic microscopic hematuria: Secondary | ICD-10-CM | POA: Diagnosis not present

## 2020-09-22 DIAGNOSIS — K5792 Diverticulitis of intestine, part unspecified, without perforation or abscess without bleeding: Secondary | ICD-10-CM

## 2020-09-22 DIAGNOSIS — Z79899 Other long term (current) drug therapy: Secondary | ICD-10-CM | POA: Insufficient documentation

## 2020-09-22 DIAGNOSIS — R3129 Other microscopic hematuria: Secondary | ICD-10-CM

## 2020-09-22 LAB — URINALYSIS, COMPLETE (UACMP) WITH MICROSCOPIC
Bacteria, UA: NONE SEEN
Bilirubin Urine: NEGATIVE
Glucose, UA: NEGATIVE mg/dL
Ketones, ur: NEGATIVE mg/dL
Leukocytes,Ua: NEGATIVE
Nitrite: NEGATIVE
Protein, ur: NEGATIVE mg/dL
Specific Gravity, Urine: 1.019 (ref 1.005–1.030)
pH: 6 (ref 5.0–8.0)

## 2020-09-22 LAB — CBC
HCT: 43.9 % (ref 39.0–52.0)
Hemoglobin: 14.3 g/dL (ref 13.0–17.0)
MCH: 28.1 pg (ref 26.0–34.0)
MCHC: 32.6 g/dL (ref 30.0–36.0)
MCV: 86.4 fL (ref 80.0–100.0)
Platelets: 316 10*3/uL (ref 150–400)
RBC: 5.08 MIL/uL (ref 4.22–5.81)
RDW: 13.6 % (ref 11.5–15.5)
WBC: 11.4 10*3/uL — ABNORMAL HIGH (ref 4.0–10.5)
nRBC: 0 % (ref 0.0–0.2)

## 2020-09-22 LAB — BASIC METABOLIC PANEL
Anion gap: 8 (ref 5–15)
BUN: 13 mg/dL (ref 6–20)
CO2: 26 mmol/L (ref 22–32)
Calcium: 9 mg/dL (ref 8.9–10.3)
Chloride: 103 mmol/L (ref 98–111)
Creatinine, Ser: 1.35 mg/dL — ABNORMAL HIGH (ref 0.61–1.24)
GFR, Estimated: >60 mL/min (ref >60)
Glucose, Bld: 102 mg/dL — ABNORMAL HIGH (ref 70–99)
Potassium: 3.8 mmol/L (ref 3.5–5.1)
Sodium: 137 mmol/L (ref 135–145)

## 2020-09-22 MED ORDER — CIPROFLOXACIN HCL 500 MG PO TABS: 500.0000 mg | ORAL_TABLET | Freq: Two times a day (BID) | ORAL | 0 refills | Status: AC

## 2020-09-22 MED ORDER — HYDROCODONE-ACETAMINOPHEN 5-325 MG PO TABS: 1.0000 | ORAL_TABLET | Freq: Four times a day (QID) | ORAL | 0 refills | Status: AC | PRN

## 2020-09-22 NOTE — Discharge Instructions (Signed)
Please follow-up with your primary care provider.  You may also need to call and schedule an appointment with gastroenterologist.  Return to emergency department for symptoms of change, worsen, or for new concerns if you are unable to schedule an appointment.

## 2020-09-22 NOTE — ED Notes (Signed)
ED Provider at bedside. 

## 2020-09-22 NOTE — ED Provider Notes (Signed)
[facility] Emergency Department Provider Note ____________________________________________   Event Date/Time   First MD Initiated Contact with Patient 09/22/20 1541     (approximate)  I have reviewed the triage vital signs and the nursing notes.   HISTORY  Chief Complaint Flank Pain  HPI Terry Weber is a 40 y.o. male with history of kidney stones, hypertension, and gout presents to the emergency department for treatment and evaluation of left flank pain that started 2 days ago. Similar symptoms in the past when diagnosed with kidney stone.  He does report occasional nausea without vomiting.  No dysuria.  No known hematuria.  No difficulty with urine stream.  Pain does worsen when bearing down to have a bowel movement.  Last normal bowel movement was 3 days ago.  No known fever.       Past Medical History:  Diagnosis Date  . Gout   . History of kidney stones   . Hypertension   . Sleep apnea     There are no problems to display for this patient.   Past Surgical History:  Procedure Laterality Date  . COLONOSCOPY WITH PROPOFOL N/A 03/28/2020   Procedure: COLONOSCOPY WITH PROPOFOL;  Surgeon: Regis Bill, MD;  Location: [facility] ENDOSCOPY;  Service: Endoscopy;  Laterality: N/A;    Prior to Admission medications   Medication Sig Start Date End Date Taking? Authorizing Provider  ciprofloxacin (CIPRO) 500 MG tablet Take 1 tablet (500 mg total) by mouth 2 (two) times daily for 10 days. 09/22/20 10/02/20 Yes Jacquelina Hewins B, FNP  HYDROcodone-acetaminophen (NORCO/VICODIN) 5-325 MG tablet Take 1 tablet by mouth every 6 (six) hours as needed for up to 3 days for severe pain. 09/22/20 09/25/20 Yes Coran Dipaola B, FNP  allopurinol (ZYLOPRIM) 100 MG tablet Take 100 mg by mouth 2 (two) times daily.    [provider]  atorvastatin (LIPITOR) 20 MG tablet Take 20 mg by mouth daily.    [provider]  losartan (COZAAR) 25 MG tablet Take  25 mg by mouth daily.    [provider]  oxyCODONE-acetaminophen (PERCOCET) 5-325 MG tablet Take 1 tablet by mouth every 4 (four) hours as needed for severe pain. 07/30/17   Rockne Menghini, MD  oxyCODONE-acetaminophen (PERCOCET) 5-325 MG tablet Take 1 tablet by mouth every 8 (eight) hours as needed. 08/17/19   Emily Filbert, MD    Allergies Patient has no known allergies.  History reviewed. No pertinent family history.  Social History Social History   Tobacco Use  . Smoking status: Never Smoker  . Smokeless tobacco: Never Used  Substance Use Topics  . Alcohol use: No  . Drug use: No    Review of Systems  Constitutional: No fever/chills Eyes: No visual changes. ENT: No sore throat. Cardiovascular: Denies chest pain. Respiratory: Denies shortness of breath. Gastrointestinal: No abdominal pain.  Positive for nausea, no vomiting.  No diarrhea.  No constipation. Genitourinary: Negative for dysuria. Musculoskeletal: Positive for back pain. Skin: Negative for rash. Neurological: Negative for headaches, focal weakness or numbness. ____________________________________________   PHYSICAL EXAM:  VITAL SIGNS: ED Triage Vitals [09/22/20 1456]  Enc Vitals Group     BP (!) 134/93     Pulse Rate 89     Resp 16     Temp 98.4 F (36.9 C)     Temp Source Oral     SpO2 97 %     Weight (!) 335 lb (152 kg)     Height 6'  4" (1.93 m)     Head Circumference      Peak Flow      Pain Score 8     Pain Loc      Pain Edu?      Excl. in GC?     Constitutional: Alert and oriented. Well appearing and in no acute distress. Eyes: Conjunctivae are normal. PERRL. EOMI. Head: Atraumatic. Nose: No congestion/rhinnorhea. Mouth/Throat: Mucous membranes are moist.  Oropharynx non-erythematous. Neck: No stridor.   Hematological/Lymphatic/Immunilogical: No cervical lymphadenopathy. Cardiovascular: Normal rate, regular rhythm. Grossly normal heart sounds.  Good peripheral  circulation. Respiratory: Normal respiratory effort.  No retractions. Lungs CTAB. Gastrointestinal: Soft and nontender. No distention. No abdominal bruits. No CVA tenderness. Genitourinary:  Musculoskeletal: No lower extremity tenderness nor edema.  No joint effusions. Neurologic:  Normal speech and language. No gross focal neurologic deficits are appreciated. No gait instability. Skin:  Skin is warm, dry and intact. No rash noted. Psychiatric: Mood and affect are normal. Speech and behavior are normal.  ____________________________________________   LABS (all labs ordered are listed, but only abnormal results are displayed)  Labs Reviewed  URINALYSIS, COMPLETE (UACMP) WITH MICROSCOPIC - Abnormal; Notable for the following components:      Result Value   Color, Urine YELLOW (*)    APPearance CLEAR (*)    Hgb urine dipstick MODERATE (*)    All other components within normal limits  BASIC METABOLIC PANEL - Abnormal; Notable for the following components:   Glucose, Bld 102 (*)    Creatinine, Ser 1.35 (*)    All other components within normal limits  CBC - Abnormal; Notable for the following components:   WBC 11.4 (*)    All other components within normal limits   ____________________________________________  EKG   ____________________________________________  RADIOLOGY  ED MD interpretation:    Bilateral, non obstructing kidney stones. Acute, non perforated diverticulitis.  I, Kem Boroughs, personally viewed and evaluated these images (plain radiographs) as part of my medical decision making, as well as reviewing the written report by the radiologist.  Official radiology report(s): CT Renal Stone Study  Result Date: 09/22/2020 CLINICAL DATA:  Left flank pain with hematuria EXAM: CT ABDOMEN AND PELVIS WITHOUT CONTRAST TECHNIQUE: Multidetector CT imaging of the abdomen and pelvis was performed following the standard protocol without IV contrast. COMPARISON:  CT 08/17/2019  FINDINGS: Lower chest: Lung bases demonstrate no acute consolidation or effusion. Normal cardiac size Hepatobiliary: No focal liver abnormality is seen. No gallstones, gallbladder wall thickening, or biliary dilatation. Pancreas: Unremarkable. No pancreatic ductal dilatation or surrounding inflammatory changes. Spleen: Normal in size without focal abnormality. Adrenals/Urinary Tract: Adrenal glands are normal. Kidneys show no hydronephrosis. Small nonobstructing stones within both kidneys. Urinary bladder is slightly thick walled. Stomach/Bowel: Stomach is within normal limits. Appendix appears normal. No evidence of bowel wall thickening, distention, or inflammatory changes. Diverticular disease of the left colon with mild inflammatory change at the proximal descending colon, likely due to acute diverticulitis. Vascular/Lymphatic: Mild aortic atherosclerosis. No aneurysm. No suspicious nodes Reproductive: Negative for mass Other: Negative for pelvic effusion or free air. Small fat containing umbilical hernia Musculoskeletal: No acute or significant osseous findings. IMPRESSION: 1. Findings consistent with acute non perforated diverticulitis involving the proximal descending colon. 2. Nonobstructing bilateral kidney stones 3. Slightly thick-walled appearance of urinary bladder which may be correlated with urinalysis Electronically Signed   By: Jasmine Pang M.D.   On: 09/22/2020 16:59    ____________________________________________   PROCEDURES  Procedure(s) performed (including  Critical Care):  Procedures  ____________________________________________   INITIAL IMPRESSION / ASSESSMENT AND PLAN     40 year old male presenting to the emergency department for treatment and evaluation of left flank pain x2 days.  See HPI for further details.  Plan will be to review labs drawn while awaiting ER room assignment.  He was encouraged to provide a urine specimen.  Will await results and CT if  indicated.  DIFFERENTIAL DIAGNOSIS  Pyelonephritis, prostatitis, kidney stone  ED COURSE  ----------------------------------------- 4:28 PM on 09/22/2020 -----------------------------------------  Urinalysis and labs reviewed with patient. Due to moderate Hgb in urine, plan will be to order CT for renal stone. Patient aware and agreeable. He declines pain or nausea medication at this time.  ----------------------------------------- 5:20 PM on 09/22/2020 -----------------------------------------  Uncomplicated diverticulitis on CT. Plan will be to discharge him home on Cipro and Norco. He is to follow up with his PCP for microscopic hematuria and GI for diverticulitis. He is to return to the ER for symptoms that change or worsen or for new concerns if unable to schedule an appointment with PCP or GI.    ___________________________________________   FINAL CLINICAL IMPRESSION(S) / ED DIAGNOSES  Final diagnoses:  Diverticulitis  Microscopic hematuria     ED Discharge Orders         Ordered    ciprofloxacin (CIPRO) 500 MG tablet  2 times daily        09/22/20 1730    HYDROcodone-acetaminophen (NORCO/VICODIN) 5-325 MG tablet  Every 6 hours PRN        09/22/20 1730           Anden Roldan Laforest was evaluated in Emergency Department on 09/22/2020 for the symptoms described in the history of present illness. He was evaluated in the context of the global COVID-19 pandemic, which necessitated consideration that the patient might be at risk for infection with the SARS-CoV-2 virus that causes COVID-19. Institutional protocols and algorithms that pertain to the evaluation of patients at risk for COVID-19 are in a state of rapid change based on information released by regulatory bodies including the CDC and federal and state organizations. These policies and algorithms were followed during the patient's care in the ED.   Note:  This document was prepared using Dragon voice recognition  software and may include unintentional dictation errors.   Chinita Pester, FNP 09/22/20 Raiford Noble, MD 09/27/20 0120

## 2020-09-22 NOTE — ED Triage Notes (Signed)
Pt comes into the ED via POV c/o left flank pain that started two days ago.  PT states he has a h/o kidney stones in the past, but they have never hurt this bad.  Pt denies any difficulty urinating at this time.  Pt does admit to some nausea.  Pt ambulatory to triage at this time and in NAD.

## 2020-11-15 ENCOUNTER — Emergency Department: Payer: BC Managed Care – PPO

## 2020-11-15 ENCOUNTER — Emergency Department
Admission: EM | Admit: 2020-11-15 | Discharge: 2020-11-15 | Disposition: A | Discharge status: Home / Self Care | Payer: BC Managed Care – PPO | Attending: Emergency Medicine | Admitting: Emergency Medicine

## 2020-11-15 ENCOUNTER — Other Ambulatory Visit: Payer: Self-pay

## 2020-11-15 DIAGNOSIS — R519 Headache, unspecified: Secondary | ICD-10-CM | POA: Diagnosis not present

## 2020-11-15 DIAGNOSIS — I10 Essential (primary) hypertension: Secondary | ICD-10-CM | POA: Insufficient documentation

## 2020-11-15 DIAGNOSIS — Z79899 Other long term (current) drug therapy: Secondary | ICD-10-CM | POA: Diagnosis not present

## 2020-11-15 DIAGNOSIS — R202 Paresthesia of skin: Secondary | ICD-10-CM | POA: Diagnosis not present

## 2020-11-15 DIAGNOSIS — R531 Weakness: Secondary | ICD-10-CM | POA: Insufficient documentation

## 2020-11-15 DIAGNOSIS — R200 Anesthesia of skin: Secondary | ICD-10-CM | POA: Diagnosis present

## 2020-11-15 HISTORY — DX: Bell's palsy: G51.0

## 2020-11-15 HISTORY — DX: Prediabetes: R73.03

## 2020-11-15 HISTORY — DX: Diverticulitis of intestine, part unspecified, without perforation or abscess without bleeding: K57.92

## 2020-11-15 LAB — CBC
HCT: 47.4 % (ref 39.0–52.0)
Hemoglobin: 15.1 g/dL (ref 13.0–17.0)
MCH: 27.4 pg (ref 26.0–34.0)
MCHC: 31.9 g/dL (ref 30.0–36.0)
MCV: 86 fL (ref 80.0–100.0)
Platelets: 323 10*3/uL (ref 150–400)
RBC: 5.51 MIL/uL (ref 4.22–5.81)
RDW: 13.5 % (ref 11.5–15.5)
WBC: 11.4 10*3/uL — ABNORMAL HIGH (ref 4.0–10.5)
nRBC: 0 % (ref 0.0–0.2)

## 2020-11-15 LAB — BASIC METABOLIC PANEL
Anion gap: 7 (ref 5–15)
BUN: 16 mg/dL (ref 6–20)
CO2: 28 mmol/L (ref 22–32)
Calcium: 9 mg/dL (ref 8.9–10.3)
Chloride: 106 mmol/L (ref 98–111)
Creatinine, Ser: 1.3 mg/dL — ABNORMAL HIGH (ref 0.61–1.24)
GFR, Estimated: >60 mL/min (ref >60)
Glucose, Bld: 98 mg/dL (ref 70–99)
Potassium: 3.9 mmol/L (ref 3.5–5.1)
Sodium: 141 mmol/L (ref 135–145)

## 2020-11-15 NOTE — ED Notes (Signed)
EDP at bedside.  Pt c/o numbness in L face and L arm that started 3d ago. Hx bells palsy 6 years ago. States feels nerve sensations/irritation in L face.

## 2020-11-15 NOTE — ED Notes (Signed)
Pt on phone with MRI screener. 

## 2020-11-15 NOTE — ED Triage Notes (Signed)
Pt comes with c/o constipation that started this weekend. Pt states hx of diverticulitis.  Pt states on Sunday he felt numbness to left side of face and arm. Pt states this continued and has now gotten worse.  Pt denies any pain. Pt states some dizziness.

## 2020-11-15 NOTE — ED Provider Notes (Signed)
[facility] Emergency Department Provider Note  Time seen: 9:00 AM  I have reviewed the triage vital signs and the nursing notes.   HISTORY  Chief Complaint Numbness and Weakness   HPI Terry Weber is a 40 y.o. male with a past medical history of Bell's palsy, hypertension, presents to the emergency department for numbness and weakness sensation to his left face and left arm.  According to the patient for the past 2 days he has been feeling numbness and tingling in his left face as well as left upper extremity.  States they feel weak as well.  Patient states a history of Bell's palsy to the left side of his face approximately 6 years ago but had since recovered.  Patient denies any history of stroke or family history of stroke.  Patient has no pain, besides mild left-sided headache.   Past Medical History:  Diagnosis Date   Bell's palsy    Diverticulitis    Gout    History of kidney stones    Hypertension    Prediabetes    Sleep apnea     There are no problems to display for this patient.   Past Surgical History:  Procedure Laterality Date   COLONOSCOPY WITH PROPOFOL N/A 03/28/2020   Procedure: COLONOSCOPY WITH PROPOFOL;  Surgeon: Regis Bill, MD;  Location: [facility] ENDOSCOPY;  Service: Endoscopy;  Laterality: N/A;    Prior to Admission medications   Medication Sig Start Date End Date Taking? Authorizing Provider  allopurinol (ZYLOPRIM) 100 MG tablet Take 100 mg by mouth 2 (two) times daily.    [provider]  atorvastatin (LIPITOR) 20 MG tablet Take 20 mg by mouth daily.    [provider]  losartan (COZAAR) 25 MG tablet Take 25 mg by mouth daily.    [provider]  oxyCODONE-acetaminophen (PERCOCET) 5-325 MG tablet Take 1 tablet by mouth every 4 (four) hours as needed for severe pain. 07/30/17   Rockne Menghini, MD  oxyCODONE-acetaminophen (PERCOCET) 5-325 MG tablet Take 1 tablet by mouth every 8 (eight)  hours as needed. 08/17/19   Emily Filbert, MD    No Known Allergies  No family history on file.  Social History Social History   Tobacco Use   Smoking status: Never   Smokeless tobacco: Never  Substance Use Topics   Alcohol use: No   Drug use: No    Review of Systems Constitutional: Negative for fever. Cardiovascular: Negative for chest pain. Respiratory: Negative for shortness of breath. Gastrointestinal: Negative for abdominal pain Musculoskeletal: Negative for musculoskeletal complaints Neurological: Left-sided headache.  Numbness/weakness the left face and left arm. All other ROS negative  ____________________________________________   PHYSICAL EXAM:  VITAL SIGNS: ED Triage Vitals  Enc Vitals Group     BP 11/15/20 0804 133/88     Pulse Rate 11/15/20 0804 79     Resp 11/15/20 0804 18     Temp 11/15/20 0804 98 F (36.7 C)     Temp src --      SpO2 11/15/20 0804 100 %     Weight --      Height --      Head Circumference --      Peak Flow --      Pain Score 11/15/20 0801 0     Pain Loc --      Pain Edu? --      Excl. in GC? --    Constitutional: Alert and oriented. Well appearing and in  no distress. Eyes: Normal exam ENT      Head: Normocephalic and atraumatic.      Mouth/Throat: Mucous membranes are moist. Cardiovascular: Normal rate, regular rhythm.  Respiratory: Normal respiratory effort without tachypnea nor retractions. Breath sounds are clear  Gastrointestinal: Soft and nontender. No distention.  Musculoskeletal: Nontender with normal range of motion in all extremities.  Neurologic:  Normal speech and language.  Equal grip strength bilaterally.  5/5 strength in bilateral extremities.  No upper or lower drift.  Cranial nerves objectively intact although patient states decreased sensation to the left face and left arm. Skin:  Skin is warm, dry and intact.  Psychiatric: Mood and affect are normal. Speech and behavior are normal.    ____________________________________________    EKG  EKG viewed and interpreted by myself shows a sinus rhythm at 72 bpm with a narrow QRS, normal axis, normal intervals, no concerning ST changes.  ____________________________________________    RADIOLOGY  MRI is negative for acute abnormality.  ____________________________________________   INITIAL IMPRESSION / ASSESSMENT AND PLAN / ED COURSE  Pertinent labs & imaging results that were available during my care of the patient were reviewed by me and considered in my medical decision making (see chart for details).   Patient presents to the emergency department for 2 days of left face and left upper extremity numbness and weakness.  Overall patient appears well, no objective findings on neurologic testing however patient does states subjective decrease in sensation to the left arm and left face.  We will check labs, obtain an MRI of the brain and continue to closely monitor.  Patient agreeable plan of care.  Patient does state the left arm numbness seems worse if he stretches his arm backwards, possibly indicating a peripheral cause for the arm but would not explain the face.  Patient's MRI is negative.  After speaking to the patient about this he states the left face symptoms have been ongoing and intermittent since he had Bell's palsy many years ago.  Patient states the left arm numbness and paresthesias is worse if he moves his arm back or stretches it backwards, possibly indicating a peripheral nerve issue.  Given the reassuring MRI I believe the patient would be safe for discharge home with PCP follow-up.  Patient agreeable to plan of care.  Terry Weber was evaluated in Emergency Department on 11/15/2020 for the symptoms described in the history of present illness. He was evaluated in the context of the global COVID-19 pandemic, which necessitated consideration that the patient might be at risk for infection with the  SARS-CoV-2 virus that causes COVID-19. Institutional protocols and algorithms that pertain to the evaluation of patients at risk for COVID-19 are in a state of rapid change based on information released by regulatory bodies including the CDC and federal and state organizations. These policies and algorithms were followed during the patient's care in the ED.  ____________________________________________   FINAL CLINICAL IMPRESSION(S) / ED DIAGNOSES  Numbness Paresthesias   Minna Antis, MD 11/15/20 1009

## 2021-04-22 IMAGING — CT CT RENAL STONE PROTOCOL
2 of 4 series · 15 of 46 positions shown, 17 images · non-contrast
Comparison: July 30, 2017

CLINICAL DATA: Left flank region pain

EXAM:
CT ABDOMEN AND PELVIS WITHOUT CONTRAST
TECHNIQUE: Multidetector CT imaging of the abdomen and pelvis was performed
following the standard protocol without oral or IV contrast.

[Series 2: stone full standard · axial · 0.79mm/px · z∈[-856,-336]mm · 12 of 114 slices shown, 14 images]
[im 5/114  soft-tissue]
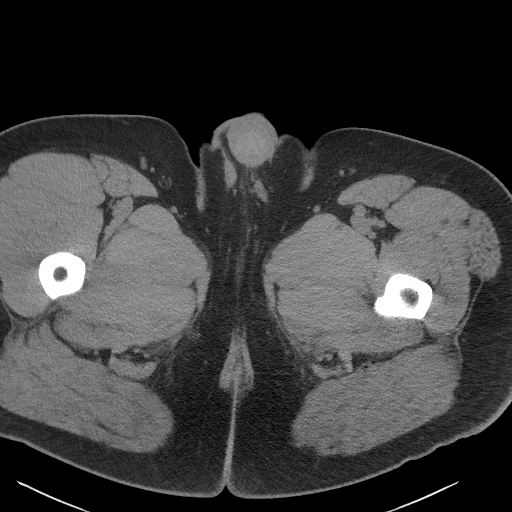
[im 5/114  bone]
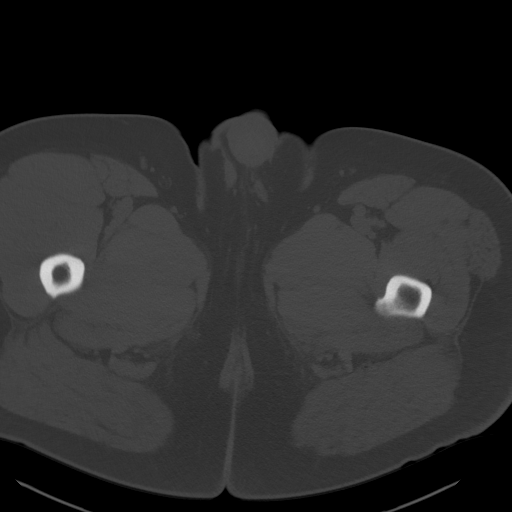
[im 15/114  soft-tissue]
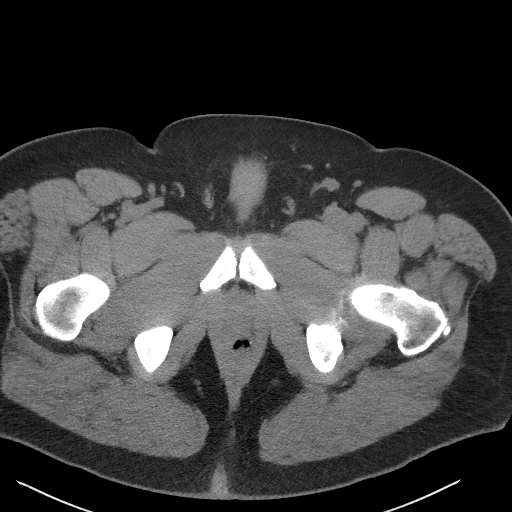
[im 25/114  soft-tissue]
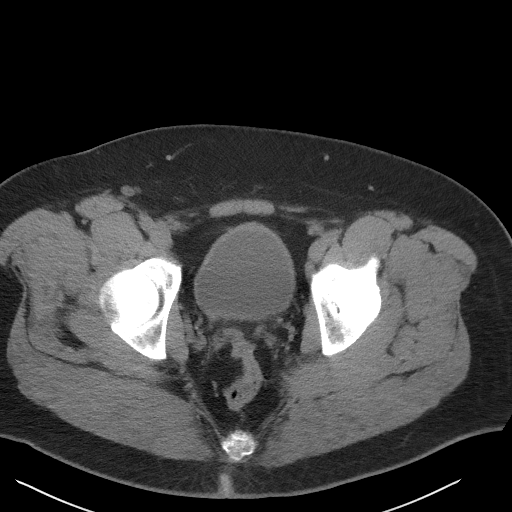
[im 35/114  soft-tissue]
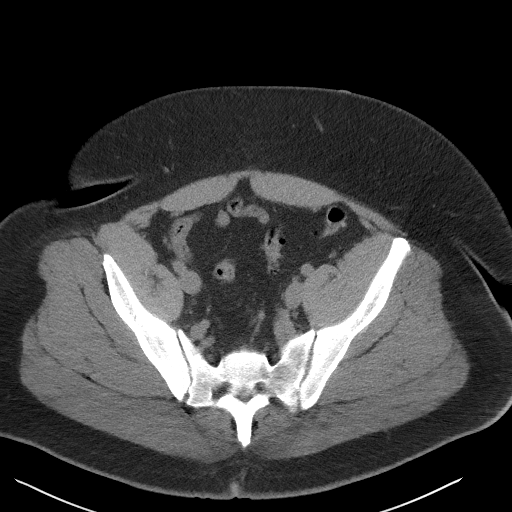
[im 45/114  soft-tissue]
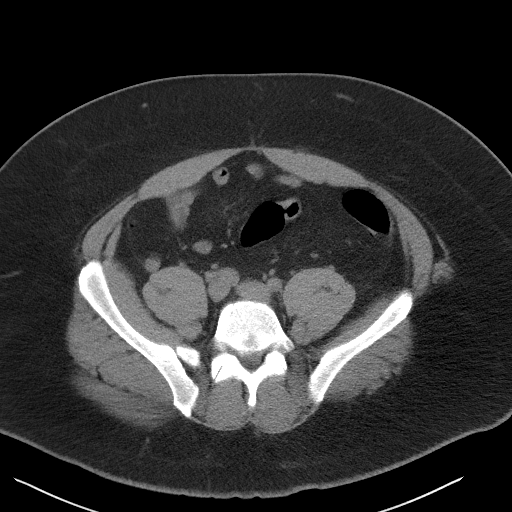
[im 55/114  soft-tissue]
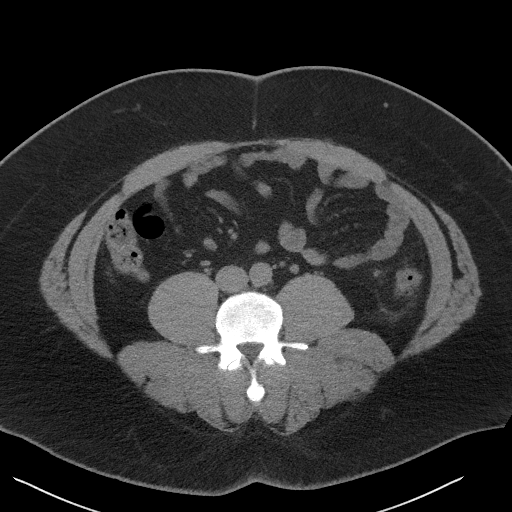
[im 59/114  soft-tissue]
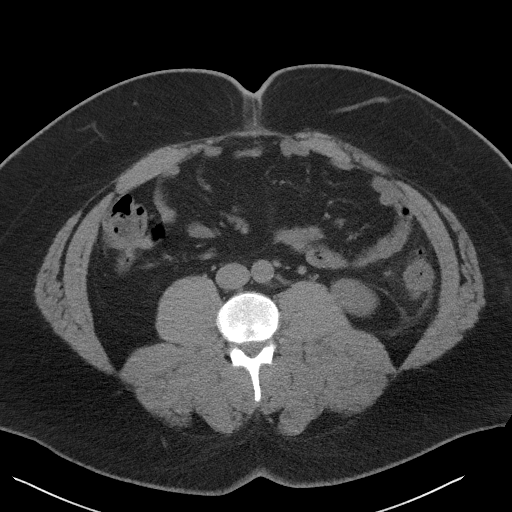
[im 69/114  soft-tissue]
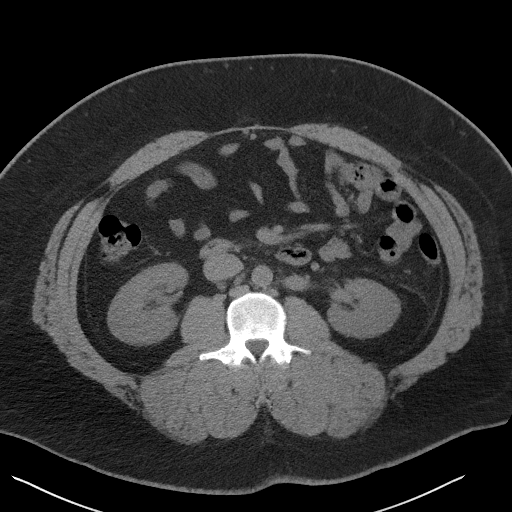
[im 79/114  soft-tissue]
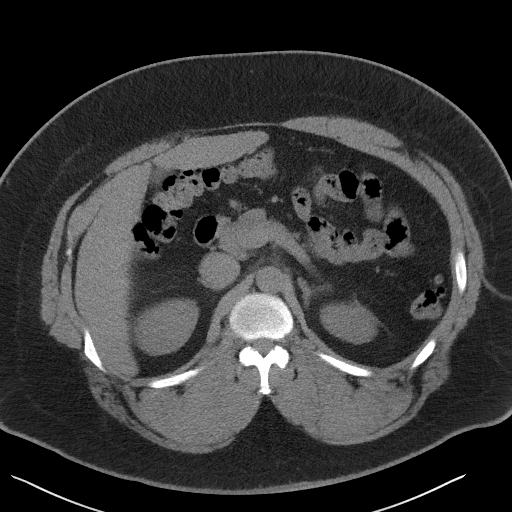
[im 79/114  bone]
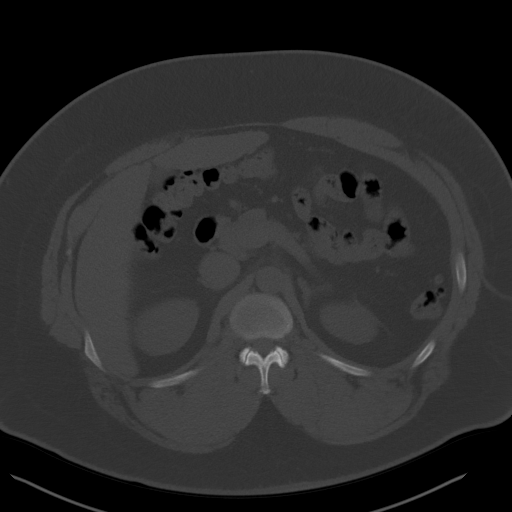
[im 89/114  soft-tissue]
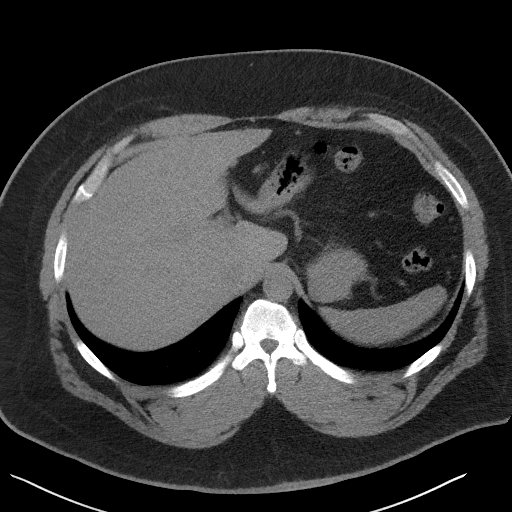
[im 99/114  soft-tissue]
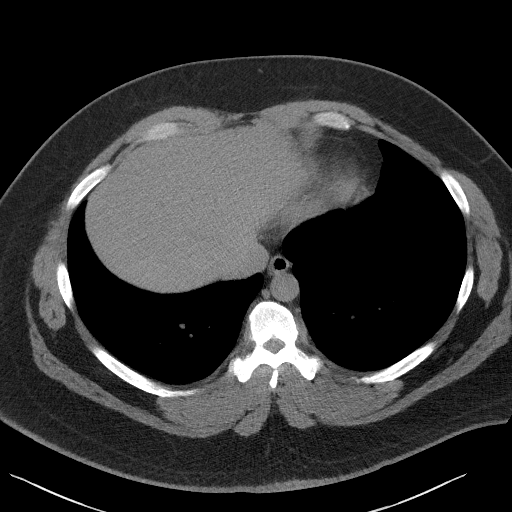
[im 109/114  soft-tissue]
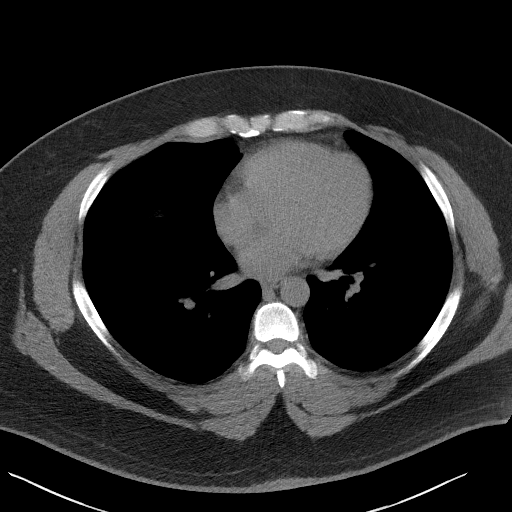

[Series 5: coronal · coronal · 0.83mm/px · 3 of 180 slices shown]
[im 60/180  soft-tissue]
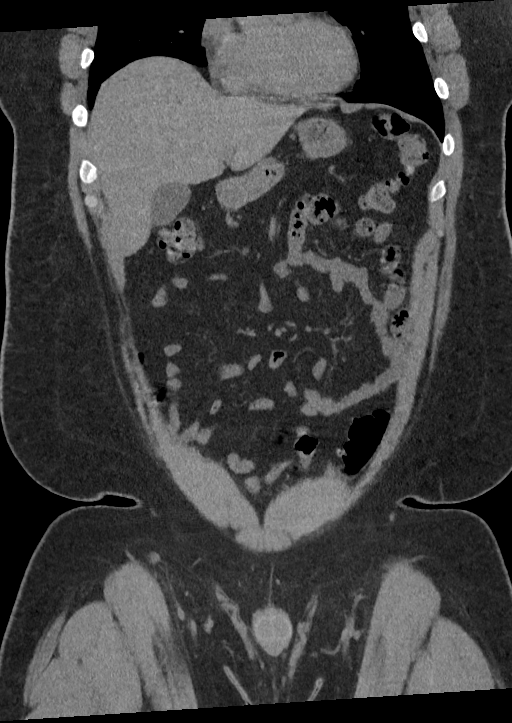
[im 80/180  soft-tissue]
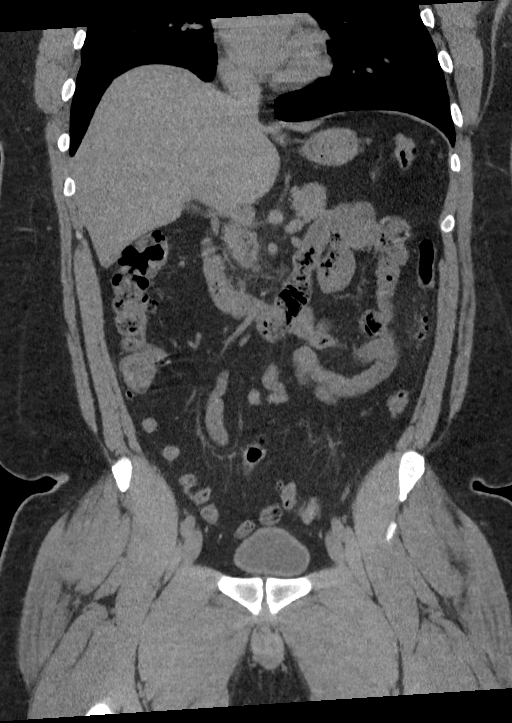
[im 100/180  soft-tissue]
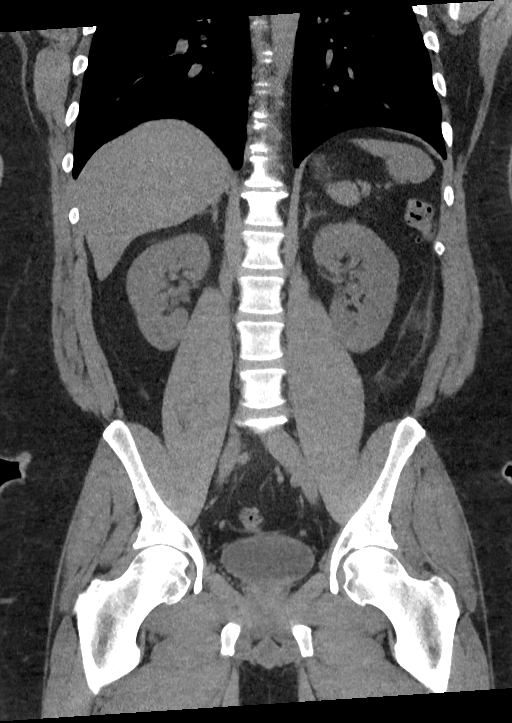

[15 of 46 positions shown; findings below may reference images not displayed]

FINDINGS: Lower chest: Lung bases are clear.

Hepatobiliary: No focal liver lesions are evident. Gallbladder wall
is not appreciably thickened. There is no biliary duct dilatation.

Pancreas: There is no pancreatic mass or inflammatory focus.

Spleen: No splenic lesions are evident.

Adrenals/Urinary Tract: Adrenals bilaterally appear normal. Kidneys
bilaterally show no evident mass or hydronephrosis on either side.
There is a 2 mm calculus in the upper pole of the left kidney. There
is a 2 mm calculus in the lower pole of the right kidney. No evident
ureteral calculus on either side. Urinary bladder is midline with
wall thickness within normal limits.

Stomach/Bowel: There are multiple diverticula in the descending
colon and sigmoid colon regions. There is localized wall thickening
in the distal sigmoid colon with mild adjacent mesenteric thickening
and slight fluid in the nearby left lateral conal fascia. This
appearance is consistent with a focal area of diverticulitis. No
evident perforation or abscess in this area. No other areas of bowel
inflammation or bowel wall thickening evident. No evident bowel
obstruction. The terminal ileum appears normal. There is no evident
free air or portal venous air.

Vascular/Lymphatic: There is no abdominal aortic aneurysm. There is
mild calcification in the proximal common iliac arteries. There is a
retroaortic left renal vein, an anatomic variant. There is no
evident adenopathy in the abdomen or pelvis.

Reproductive: Prostate and seminal vesicles are normal in size and
contour. There is no evident pelvic mass.

Other: Appendix appears normal. No evident abscess or ascites in the
abdomen or pelvis. There is a small umbilical hernia containing only
fat.

Musculoskeletal: No blastic or lytic bone lesions. Broad-based disc
bulging is noted at L4-5. No intramuscular lesions are evident.
IMPRESSION: 1. Localized diverticulitis in the distal descending colon. No
evident perforation or abscess in this area. Foci of diverticulosis
noted elsewhere in the descending and sigmoid colon regions without
inflammation elsewhere beyond the localized diverticulitis in the
distal descending colon.

2. No bowel obstruction. No abscess in the abdomen or pelvis.
Appendix appears normal.

3. Nonobstructing 2 mm calculus in each kidney. No hydronephrosis or
ureteral calculus on either side. Urinary bladder wall thickness
normal.

4.  Small umbilical hernia containing only fat.

## 2022-07-22 IMAGING — MR MR HEAD W/O CM
11 series · 48 of 48 positions shown · non-contrast
Comparison: None.

CLINICAL DATA: Neuro deficit, acute stroke suspected. Left face/arm
numbness.

EXAM:
MRI HEAD WITHOUT CONTRAST
TECHNIQUE: Multiplanar, multiecho pulse sequences of the brain and surrounding
structures were obtained without intravenous contrast.

[Series 5: ax dwi_tracew · axial · 3.0mm · 0.71mm/px · z∈[-83,+77]mm · 5 of 56 slices shown]
[im 1/56]
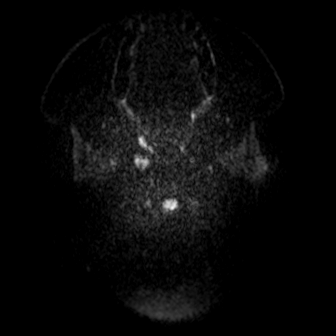
[im 14/56]
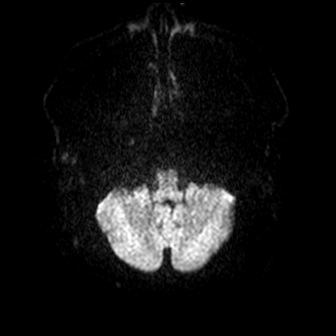
[im 28/56]
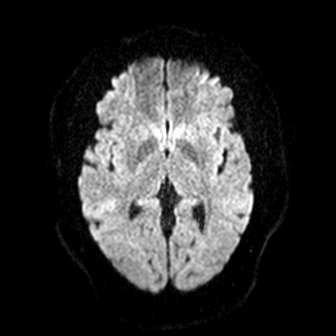
[im 42/56]
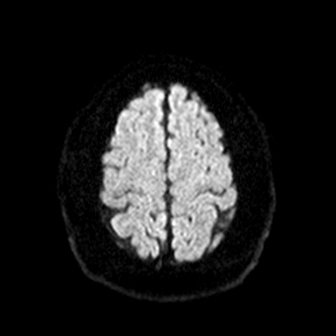
[im 56/56]
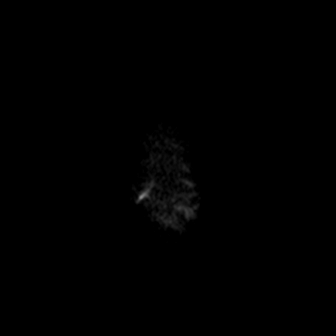

[Series 6: ax dwi_adc · axial · 3.0mm · 0.71mm/px · z∈[-83,+77]mm · 4 of 56 slices shown]
[im 1/56]
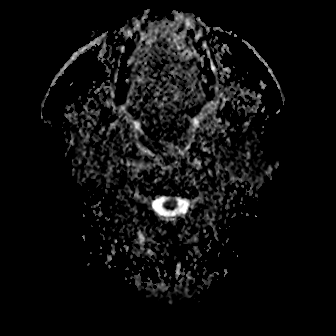
[im 19/56]
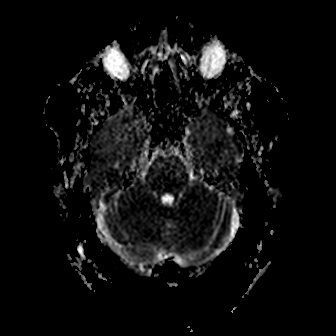
[im 37/56]
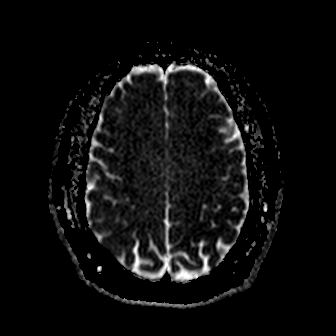
[im 56/56]
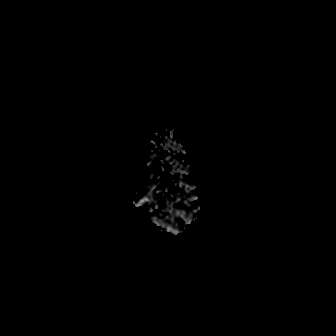

[Series 7: cor dwi_tracew · coronal · 5.0mm · 0.68mm/px · 3 of 40 slices shown]
[im 1/40]
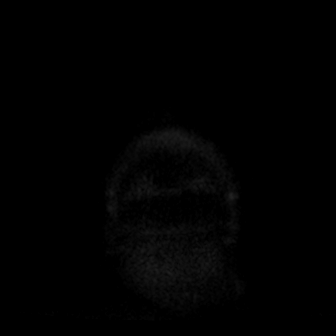
[im 20/40]
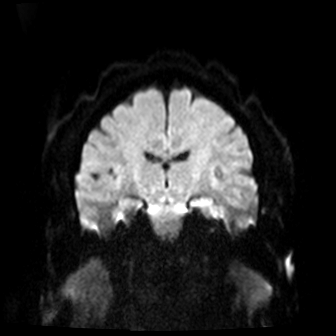
[im 40/40]
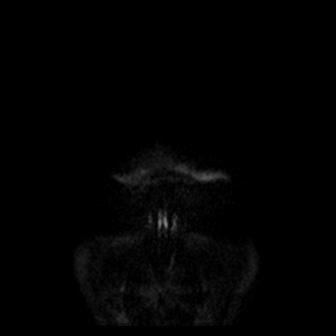

[Series 8: cor dwi_adc · coronal · 5.0mm · 0.68mm/px · 3 of 40 slices shown]
[im 1/40]
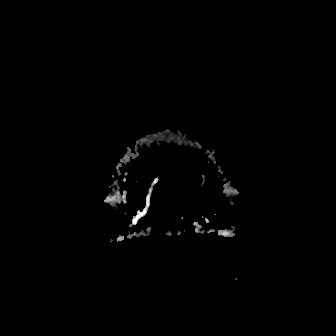
[im 20/40]
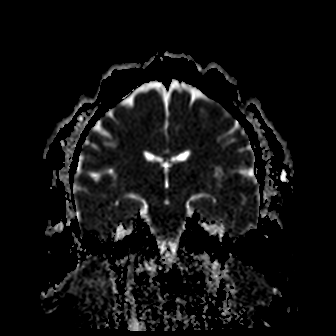
[im 40/40]
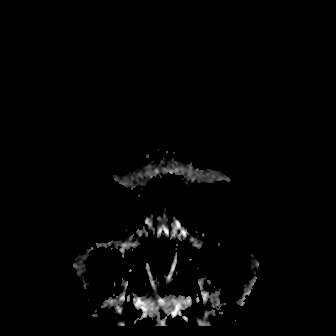

[Series 9: T1 · sagittal · 5.0mm · 0.62mm/px · 2 of 25 slices shown (1 of 2)]
[im 1/25]
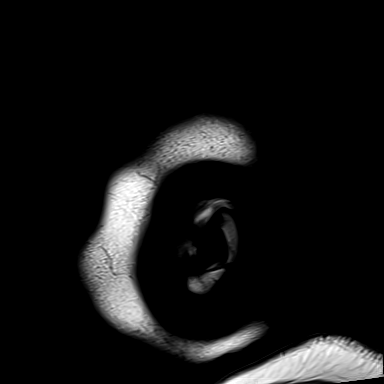
[im 25/25]
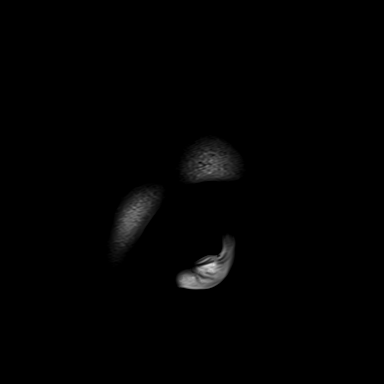

[Series 10: T2 · axial · 5.0mm · 0.53mm/px · z∈[-78,+73]mm · 2 of 27 slices shown (1 of 2)]
[im 1/27]
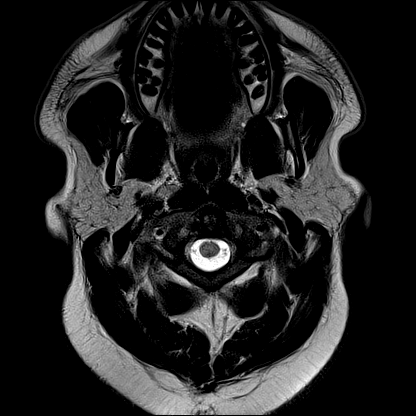
[im 27/27]
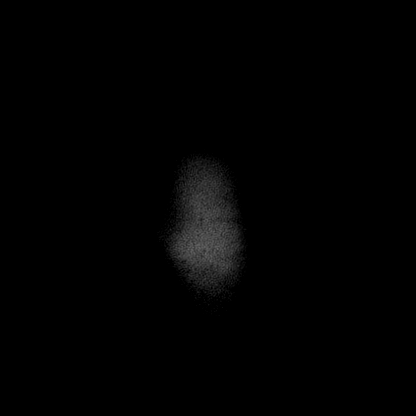

[Series 12: pha_images · axial · 3.0mm · 0.90mm/px · z∈[-87,+85]mm · 5 of 60 slices shown]
[im 1/60]
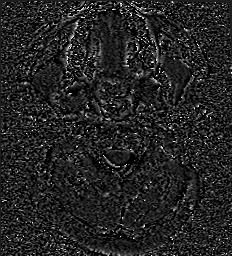
[im 15/60]
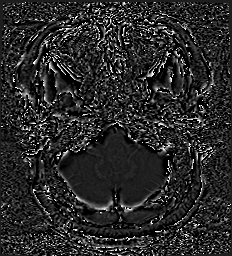
[im 30/60]
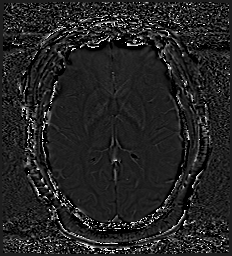
[im 45/60]
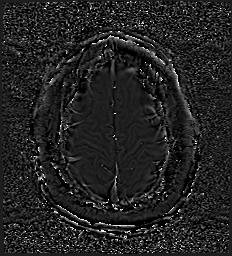
[im 60/60]
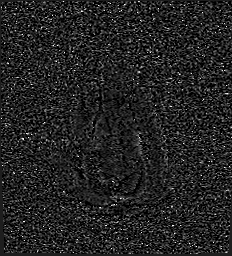

[Series 13: swi_images · axial · 3.0mm · 0.90mm/px · z∈[-87,+85]mm · 5 of 60 slices shown]
[im 1/60]
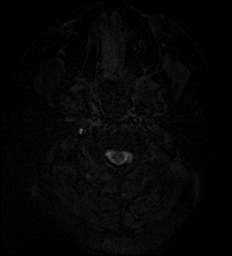
[im 15/60]
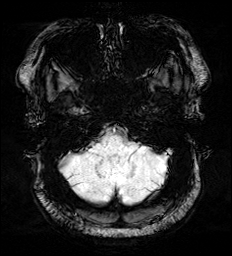
[im 30/60]
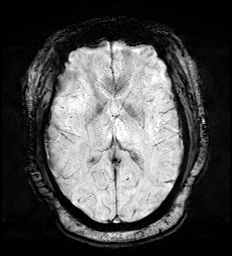
[im 45/60]
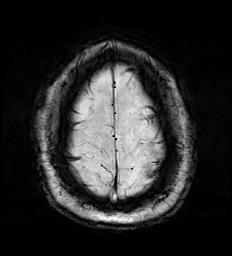
[im 60/60]
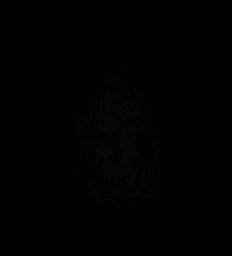

[Series 15: FLAIR · axial · 3.0mm · 0.53mm/px · z∈[-81,+76]mm · 4 of 55 slices shown]
[im 1/55]
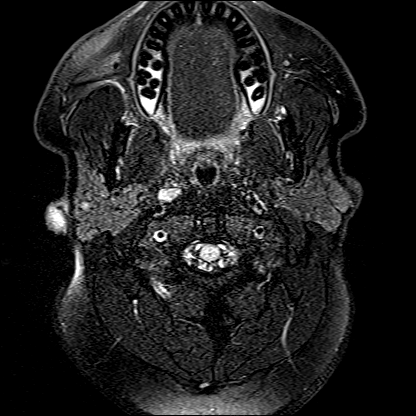
[im 19/55]
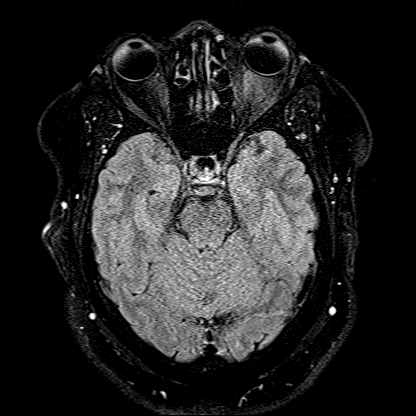
[im 37/55]
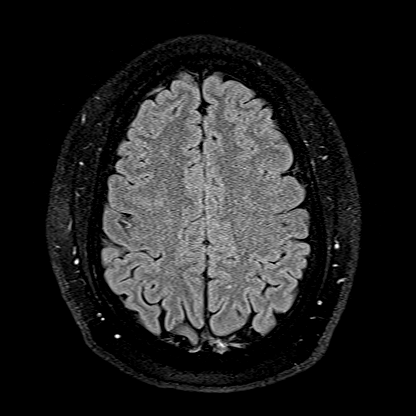
[im 55/55]
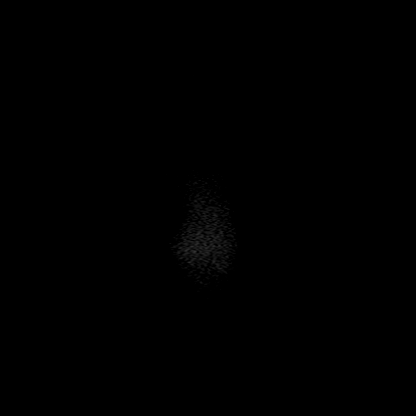

[Series 16: T1 · axial · 1.0mm · 0.98mm/px · z∈[-78,+92]mm · 13 of 175 slices shown (2 of 2)]
[im 1/175]
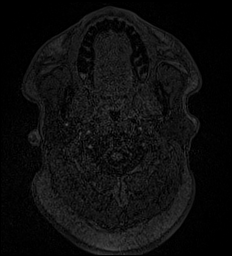
[im 15/175]
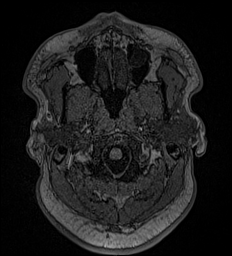
[im 30/175]
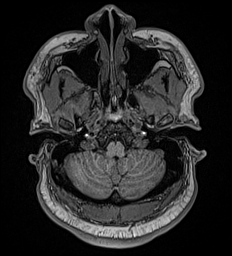
[im 44/175]
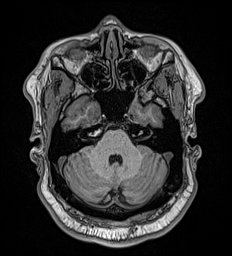
[im 59/175]
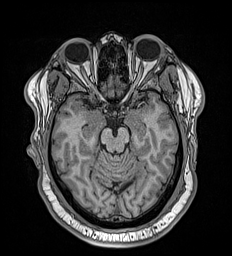
[im 73/175]
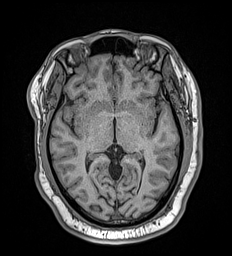
[im 88/175]
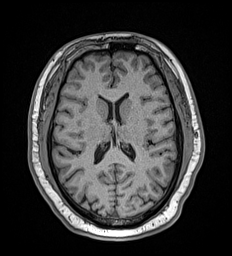
[im 102/175]
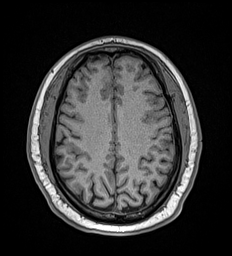
[im 117/175]
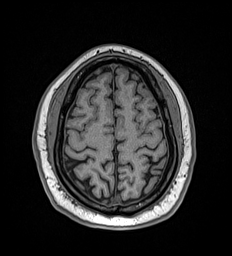
[im 131/175]
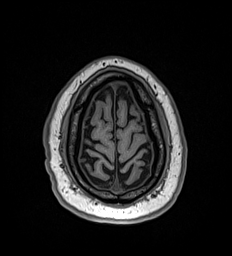
[im 146/175]
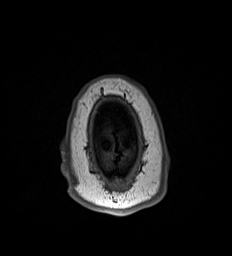
[im 160/175]
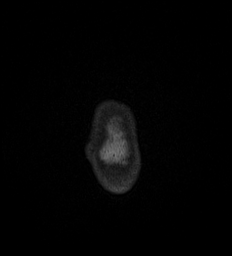
[im 175/175]
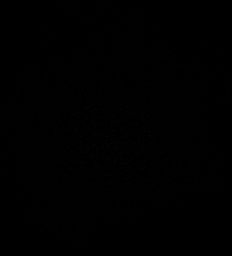

[Series 17: T2 · coronal · 5.0mm · 0.57mm/px · 2 of 29 slices shown (2 of 2)]
[im 1/29]
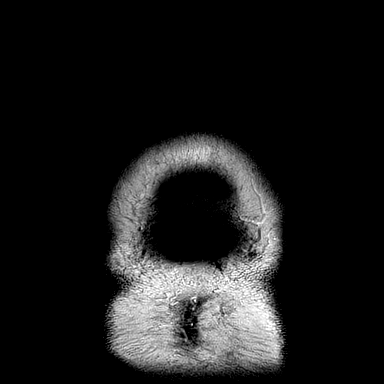
[im 29/29]
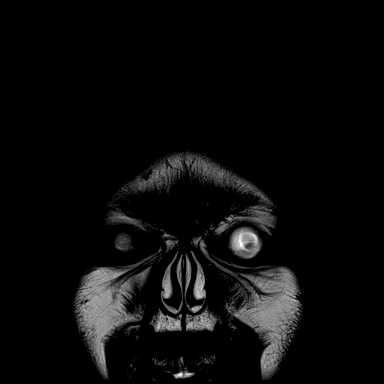

[48 of 48 positions shown; findings below may reference images not displayed]

FINDINGS: Brain: No acute infarction, hemorrhage, hydrocephalus, extra-axial
collection or mass lesion.

Vascular: Major arterial flow voids are maintained at the skull
base.

Skull and upper cervical spine: Normal marrow signal.

Sinuses/Orbits: Inferior left maxillary sinus retention cyst.
Otherwise, clear sinuses. Unremarkable orbits.

Other: No mastoid effusions.
IMPRESSION: No evidence of acute intracranial abnormality. Specifically, no
acute infarct.

## 2023-08-05 ENCOUNTER — Emergency Department: Payer: Self-pay

## 2023-08-05 ENCOUNTER — Encounter: Payer: Self-pay | Admitting: Emergency Medicine

## 2023-08-05 ENCOUNTER — Emergency Department
Admission: EM | Admit: 2023-08-05 | Discharge: 2023-08-05 | Disposition: A | Discharge status: Home / Self Care | Payer: Self-pay | Attending: Emergency Medicine | Admitting: Emergency Medicine

## 2023-08-05 ENCOUNTER — Other Ambulatory Visit: Payer: Self-pay

## 2023-08-05 DIAGNOSIS — Y92410 Unspecified street and highway as the place of occurrence of the external cause: Secondary | ICD-10-CM | POA: Diagnosis not present

## 2023-08-05 DIAGNOSIS — S161XXA Strain of muscle, fascia and tendon at neck level, initial encounter: Secondary | ICD-10-CM | POA: Diagnosis not present

## 2023-08-05 DIAGNOSIS — I10 Essential (primary) hypertension: Secondary | ICD-10-CM | POA: Diagnosis not present

## 2023-08-05 DIAGNOSIS — M25512 Pain in left shoulder: Secondary | ICD-10-CM | POA: Insufficient documentation

## 2023-08-05 DIAGNOSIS — R030 Elevated blood-pressure reading, without diagnosis of hypertension: Secondary | ICD-10-CM

## 2023-08-05 DIAGNOSIS — M542 Cervicalgia: Secondary | ICD-10-CM | POA: Diagnosis present

## 2023-08-05 DIAGNOSIS — E049 Nontoxic goiter, unspecified: Secondary | ICD-10-CM | POA: Diagnosis not present

## 2023-08-05 NOTE — Discharge Instructions (Signed)
 Follow-up with your primary care provider if any continued problems or concerns.  Also make an appointment for follow-up and outpatient ultrasound of your thyroid as there was some small enlargement noted on your CT scan and needs more direct pictures of your thyroid.  You may take Tylenol or ibuprofen as needed for soreness and stiffness.  Most people will experience soreness and stiffness for approximately 4 to 5 days.  You may also use ice or moist heat to the areas as needed for discomfort.
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
